# Patient Record
Sex: Male | Born: 1940 | Race: White | Hispanic: No | Marital: Married | Smoking: Former smoker
Health system: Southern US, Community
[De-identification: ages and names within clinical notes are randomized; demographics above are authoritative.]

## PROBLEM LIST (undated history)

## (undated) DIAGNOSIS — I10 Essential (primary) hypertension: Secondary | ICD-10-CM

## (undated) DIAGNOSIS — Z973 Presence of spectacles and contact lenses: Secondary | ICD-10-CM

## (undated) DIAGNOSIS — I2510 Atherosclerotic heart disease of native coronary artery without angina pectoris: Secondary | ICD-10-CM

## (undated) DIAGNOSIS — M1990 Unspecified osteoarthritis, unspecified site: Secondary | ICD-10-CM

## (undated) DIAGNOSIS — I739 Peripheral vascular disease, unspecified: Secondary | ICD-10-CM

## (undated) DIAGNOSIS — E785 Hyperlipidemia, unspecified: Secondary | ICD-10-CM

## (undated) DIAGNOSIS — R351 Nocturia: Secondary | ICD-10-CM

## (undated) DIAGNOSIS — Z951 Presence of aortocoronary bypass graft: Secondary | ICD-10-CM

## (undated) DIAGNOSIS — Z972 Presence of dental prosthetic device (complete) (partial): Secondary | ICD-10-CM

## (undated) DIAGNOSIS — C679 Malignant neoplasm of bladder, unspecified: Secondary | ICD-10-CM

## (undated) DIAGNOSIS — E039 Hypothyroidism, unspecified: Secondary | ICD-10-CM

## (undated) HISTORY — PX: CORONARY ARTERY BYPASS GRAFT: SHX141

## (undated) HISTORY — DX: Hyperlipidemia, unspecified: E78.5

## (undated) HISTORY — DX: Essential (primary) hypertension: I10

---

## 1974-05-10 HISTORY — PX: KNEE SURGERY: SHX244

## 1998-08-30 ENCOUNTER — Emergency Department (HOSPITAL_COMMUNITY): Admission: EM | Admit: 1998-08-30 | Discharge: 1998-08-30 | Payer: Self-pay | Admitting: Emergency Medicine

## 1998-09-05 ENCOUNTER — Emergency Department (HOSPITAL_COMMUNITY): Admission: EM | Admit: 1998-09-05 | Discharge: 1998-09-05 | Payer: Self-pay | Admitting: Emergency Medicine

## 2006-09-23 ENCOUNTER — Encounter (INDEPENDENT_AMBULATORY_CARE_PROVIDER_SITE_OTHER): Payer: Self-pay | Admitting: Family Medicine

## 2006-09-23 ENCOUNTER — Ambulatory Visit: Payer: Self-pay | Admitting: Family Medicine

## 2006-09-23 ENCOUNTER — Encounter: Payer: Self-pay | Admitting: Family Medicine

## 2006-09-23 DIAGNOSIS — I2510 Atherosclerotic heart disease of native coronary artery without angina pectoris: Secondary | ICD-10-CM | POA: Insufficient documentation

## 2006-09-23 DIAGNOSIS — E785 Hyperlipidemia, unspecified: Secondary | ICD-10-CM | POA: Insufficient documentation

## 2006-09-23 DIAGNOSIS — I10 Essential (primary) hypertension: Secondary | ICD-10-CM | POA: Insufficient documentation

## 2006-09-23 DIAGNOSIS — E039 Hypothyroidism, unspecified: Secondary | ICD-10-CM

## 2006-10-02 LAB — CONVERTED CEMR LAB
ALT: 38 units/L (ref 0–53)
AST: 30 units/L (ref 0–37)
BUN: 21 mg/dL (ref 6–23)
CO2: 23 meq/L (ref 19–32)
Chloride: 103 meq/L (ref 96–112)
Cholesterol: 174 mg/dL (ref 0–200)
Glucose, Bld: 93 mg/dL (ref 70–99)
Potassium: 5.2 meq/L (ref 3.5–5.3)
Sodium: 143 meq/L (ref 135–145)
Total CHOL/HDL Ratio: 4.4

## 2006-10-04 ENCOUNTER — Telehealth (INDEPENDENT_AMBULATORY_CARE_PROVIDER_SITE_OTHER): Payer: Self-pay | Admitting: *Deleted

## 2006-12-22 ENCOUNTER — Ambulatory Visit: Payer: Self-pay | Admitting: Family Medicine

## 2006-12-28 ENCOUNTER — Ambulatory Visit: Payer: Self-pay | Admitting: Family Medicine

## 2006-12-29 ENCOUNTER — Telehealth (INDEPENDENT_AMBULATORY_CARE_PROVIDER_SITE_OTHER): Payer: Self-pay | Admitting: *Deleted

## 2006-12-29 LAB — CONVERTED CEMR LAB
Triglycerides: 183 mg/dL — ABNORMAL HIGH (ref 0–149)
VLDL: 37 mg/dL (ref 0–40)

## 2007-03-08 ENCOUNTER — Ambulatory Visit: Payer: Self-pay | Admitting: Family Medicine

## 2007-05-31 ENCOUNTER — Ambulatory Visit: Payer: Self-pay | Admitting: Family Medicine

## 2007-05-31 LAB — CONVERTED CEMR LAB
CO2: 28 meq/L (ref 19–32)
Calcium: 9.6 mg/dL (ref 8.4–10.5)
Cholesterol: 158 mg/dL (ref 0–200)
GFR calc Af Amer: 86 mL/min
Glucose, Bld: 98 mg/dL (ref 70–99)
HDL: 38.6 mg/dL — ABNORMAL LOW (ref >39.0)
PSA: 0.66 ng/mL (ref 0.10–4.00)
Sodium: 140 meq/L (ref 135–145)
TSH: 1.74 microintl units/mL (ref 0.35–5.50)
Total CHOL/HDL Ratio: 4.1

## 2007-06-01 ENCOUNTER — Telehealth (INDEPENDENT_AMBULATORY_CARE_PROVIDER_SITE_OTHER): Payer: Self-pay | Admitting: *Deleted

## 2007-07-10 ENCOUNTER — Telehealth (INDEPENDENT_AMBULATORY_CARE_PROVIDER_SITE_OTHER): Payer: Self-pay | Admitting: *Deleted

## 2007-07-27 ENCOUNTER — Telehealth (INDEPENDENT_AMBULATORY_CARE_PROVIDER_SITE_OTHER): Payer: Self-pay | Admitting: *Deleted

## 2007-08-31 ENCOUNTER — Encounter (INDEPENDENT_AMBULATORY_CARE_PROVIDER_SITE_OTHER): Payer: Self-pay | Admitting: *Deleted

## 2007-08-31 ENCOUNTER — Ambulatory Visit: Payer: Self-pay | Admitting: Internal Medicine

## 2007-08-31 LAB — CONVERTED CEMR LAB
AST: 28 units/L (ref 0–37)
HDL: 34.5 mg/dL — ABNORMAL LOW (ref >39.0)
Triglycerides: 266 mg/dL (ref 0–149)

## 2007-09-04 ENCOUNTER — Telehealth: Payer: Self-pay | Admitting: Internal Medicine

## 2007-09-22 ENCOUNTER — Telehealth (INDEPENDENT_AMBULATORY_CARE_PROVIDER_SITE_OTHER): Payer: Self-pay | Admitting: *Deleted

## 2008-01-25 ENCOUNTER — Emergency Department (HOSPITAL_COMMUNITY): Admission: EM | Admit: 2008-01-25 | Discharge: 2008-01-25 | Payer: Self-pay | Admitting: Emergency Medicine

## 2009-01-17 ENCOUNTER — Encounter: Admission: RE | Admit: 2009-01-17 | Discharge: 2009-01-17 | Payer: Self-pay | Admitting: Internal Medicine

## 2009-01-18 ENCOUNTER — Encounter: Admission: RE | Admit: 2009-01-18 | Discharge: 2009-01-18 | Payer: Self-pay | Admitting: Internal Medicine

## 2009-02-19 ENCOUNTER — Inpatient Hospital Stay (HOSPITAL_COMMUNITY): Admission: RE | Admit: 2009-02-19 | Discharge: 2009-02-20 | Payer: Self-pay | Admitting: Neurosurgery

## 2009-02-19 HISTORY — PX: ANTERIOR CERVICAL DECOMP/DISCECTOMY FUSION: SHX1161

## 2010-03-18 ENCOUNTER — Encounter: Admission: RE | Admit: 2010-03-18 | Discharge: 2010-03-18 | Payer: Self-pay | Admitting: Internal Medicine

## 2010-08-05 ENCOUNTER — Other Ambulatory Visit: Payer: Self-pay | Admitting: Internal Medicine

## 2010-08-05 DIAGNOSIS — M79669 Pain in unspecified lower leg: Secondary | ICD-10-CM

## 2010-08-06 ENCOUNTER — Ambulatory Visit
Admission: RE | Admit: 2010-08-06 | Discharge: 2010-08-06 | Disposition: A | Discharge status: Home / Self Care | Payer: Medicare Other | Source: Ambulatory Visit | Attending: Internal Medicine | Admitting: Internal Medicine

## 2010-08-06 DIAGNOSIS — M79669 Pain in unspecified lower leg: Secondary | ICD-10-CM

## 2010-08-13 LAB — CBC
HCT: 43 % (ref 39.0–52.0)
Hemoglobin: 15 g/dL (ref 13.0–17.0)
MCHC: 34.8 g/dL (ref 30.0–36.0)
RDW: 13.4 % (ref 11.5–15.5)

## 2010-08-13 LAB — BASIC METABOLIC PANEL
CO2: 25 mEq/L (ref 19–32)
Glucose, Bld: 113 mg/dL — ABNORMAL HIGH (ref 70–99)
Potassium: 4.4 mEq/L (ref 3.5–5.1)
Sodium: 138 mEq/L (ref 135–145)

## 2014-02-25 ENCOUNTER — Ambulatory Visit (INDEPENDENT_AMBULATORY_CARE_PROVIDER_SITE_OTHER): Payer: No Typology Code available for payment source

## 2014-02-25 ENCOUNTER — Ambulatory Visit (INDEPENDENT_AMBULATORY_CARE_PROVIDER_SITE_OTHER): Payer: No Typology Code available for payment source | Admitting: Podiatry

## 2014-02-25 ENCOUNTER — Encounter: Payer: Self-pay | Admitting: Podiatry

## 2014-02-25 VITALS — BP 173/69 | HR 59 | Resp 16 | Ht 69.5 in | Wt 215.0 lb

## 2014-02-25 DIAGNOSIS — M79671 Pain in right foot: Secondary | ICD-10-CM

## 2014-02-25 DIAGNOSIS — M722 Plantar fascial fibromatosis: Secondary | ICD-10-CM

## 2014-02-25 DIAGNOSIS — R0989 Other specified symptoms and signs involving the circulatory and respiratory systems: Secondary | ICD-10-CM

## 2014-02-25 NOTE — Patient Instructions (Signed)
The vascular lab we'll contact you to schedule a lower extremity arterial Doppler to check circulation in your legs/feet  Bent - Knee  Calf Stretch  1) Stand an arm's length away from a wall. Place the palms of your hands on the wall. Step forward about 12 inches with the opposite foot.  2) Keeping toes pointed forward and both heels on the floor, bend both knees and lean forward. Hold this position for 60 seconds. Don't arch your back and don't hunch your shoulders.  3) Repeat this twice.  DO THIS STRETCHING TECHNIQUE 3 TIMES A DAY.   Stretching Exercises before Standing  Pull your toes up toward your nose and hold for 1 minute before standing.  A towel can assist with this exercise if you put the towel under the ball of your foot. This exercise reduces the intense   pain associated when changing from a seated to a standing position. This stretch can usually be the most beneficial if done before getting out of bed in the mornings.    Plantar Fasciitis Plantar fasciitis is a common condition that causes foot pain. It is soreness (inflammation) of the band of tough fibrous tissue on the bottom of the foot that runs from the heel bone (calcaneus) to the ball of the foot. The cause of this soreness may be from excessive standing, poor fitting shoes, running on hard surfaces, being overweight, having an abnormal walk, or overuse (this is common in runners) of the painful foot or feet. It is also common in aerobic exercise dancers and ballet dancers. SYMPTOMS  Most people with plantar fasciitis complain of:  Severe pain in the morning on the bottom of their foot especially when taking the first steps out of bed. This pain recedes after a few minutes of walking.  Severe pain is experienced also during walking following a long period of inactivity.  Pain is worse when walking barefoot or up stairs DIAGNOSIS   Your caregiver will diagnose this condition by examining and feeling your  foot.  Special tests such as X-rays of your foot, are usually not needed. PREVENTION   Consult a sports medicine professional before beginning a new exercise program.  Walking programs offer a good workout. With walking there is a lower chance of overuse injuries common to runners. There is less impact and less jarring of the joints.  Begin all new exercise programs slowly. If problems or pain develop, decrease the amount of time or distance until you are at a comfortable level.  Wear good shoes and replace them regularly.  Stretch your foot and the heel cords at the back of the ankle (Achilles tendon) both before and after exercise.  Run or exercise on even surfaces that are not hard. For example, asphalt is better than pavement.  Do not run barefoot on hard surfaces.  If using a treadmill, vary the incline.  Do not continue to workout if you have foot or joint problems. Seek professional help if they do not improve. HOME CARE INSTRUCTIONS   Avoid activities that cause you pain until you recover.  Use ice or cold packs on the problem or painful areas after working out.  Only take over-the-counter or prescription medicines for pain, discomfort, or fever as directed by your caregiver.  Soft shoe inserts or athletic shoes with air or gel sole cushions may be helpful.  If problems continue or become more severe, consult a sports medicine caregiver or your own health care provider. Cortisone is a potent  anti-inflammatory medication that may be injected into the painful area. You can discuss this treatment with your caregiver. MAKE SURE YOU:   Understand these instructions.  Will watch your condition.  Will get help right away if you are not doing well or get worse. Document Released: 01/19/2001 Document Revised: 07/19/2011 Document Reviewed: 03/20/2008 [facility] Patient Information 2015 [place], [place]. This information is not intended to replace advice given to you by your health  care provider. Make sure you discuss any questions you have with your health care provider.

## 2014-02-25 NOTE — Progress Notes (Signed)
   Subjective:    Patient ID: Andre Maldonado, male    DOB: 10-20-1940, 73 y.o.   MRN: [id number]  HPI Comments: N heel pain L right heel D 1 month O shoveling  C pulling, stinging, sharp stabbing pain A worse after resting, and in the morning T rx orthotics, OTC orthotics, and DR Scholl's shoes     Review of Systems  All other systems reviewed and are negative.      Objective:   Physical Exam   orientated x3  Vascular: DP pulses 1/4 bilaterally PT left 0/4 PT right 2/4  Neurological: Need ankle reflex equal and reactive bilaterally  Dermatological: Texture and turgor within normal limits  Musculoskeletal: Palpable tenderness medial central inferior heel right in the fascial band without any palpable lesions. This duplicates area of discomfort  X-ray examination right foot  Intact bony structure without fracture and/or dislocation  Inferior calcaneal spur  Hammertoe second  Radiographic impression:  No acute bony abnormality noted in the right foot  Inferior calcaneal spur     Assessment & Plan:   Assessment: Diminished pedal pulses rule out peripheral arterial disease Plantar fasciitis right  Plan: Patient is referred to vascular lab for the indication of diminished pedal pulses for a lower extremity arterial Doppler. Notify patient upon receipt of vascular lab  Offered patient Kenalog injection and he verbally consents. Skin is prepped with alcohol and Betadine and 10 mg of plain Xylocaine and 2.5 mg of plain Marcaine and 10 mg a Kenalog Injection inferior heel right for Kenalog injection #1  Bent knee stretching recommended Discuss correct shoeing  Reappoint for plantar fasciitis if symptoms are not improving within 30 days

## 2014-02-26 ENCOUNTER — Encounter: Payer: Self-pay | Admitting: Podiatry

## 2014-02-26 DIAGNOSIS — M722 Plantar fascial fibromatosis: Secondary | ICD-10-CM

## 2014-02-26 MED ORDER — TRIAMCINOLONE ACETONIDE 10 MG/ML IJ SUSP
10.0000 mg | Freq: Once | INTRAMUSCULAR | Status: AC
  Administered 2014-02-26: 10 mg

## 2014-03-05 ENCOUNTER — Ambulatory Visit (HOSPITAL_COMMUNITY)
Admission: RE | Admit: 2014-03-05 | Discharge: 2014-03-05 | Disposition: A | Discharge status: Home / Self Care | Payer: Medicare Other | Source: Ambulatory Visit | Attending: Cardiology | Admitting: Cardiology

## 2014-03-05 DIAGNOSIS — R0989 Other specified symptoms and signs involving the circulatory and respiratory systems: Secondary | ICD-10-CM | POA: Insufficient documentation

## 2014-03-05 NOTE — Progress Notes (Signed)
Arterial Duplex Lower Ext. Completed. Makel Mcmann, BS, RDMS, RVT  

## 2014-03-13 ENCOUNTER — Telehealth: Payer: Self-pay | Admitting: Cardiovascular Disease

## 2014-03-14 ENCOUNTER — Telehealth: Payer: Self-pay | Admitting: *Deleted

## 2014-03-14 NOTE — Telephone Encounter (Signed)
I spoke with patient about abnormal lower extremity dopplers.  He refused to follow up with Dr Gwenlyn Found concerning his PVD.  He voiced that he wanted to see him PCP first and then decide if he would like to follow up with Dr Gwenlyn Found.

## 2014-03-18 NOTE — Telephone Encounter (Signed)
Closed encounter °

## 2014-03-25 ENCOUNTER — Encounter: Payer: Self-pay | Admitting: Podiatry

## 2014-03-25 ENCOUNTER — Ambulatory Visit (INDEPENDENT_AMBULATORY_CARE_PROVIDER_SITE_OTHER): Payer: No Typology Code available for payment source | Admitting: Podiatry

## 2014-03-25 VITALS — BP 140/66 | HR 49 | Resp 14

## 2014-03-25 DIAGNOSIS — M722 Plantar fascial fibromatosis: Secondary | ICD-10-CM

## 2014-03-25 MED ORDER — TRIAMCINOLONE ACETONIDE 10 MG/ML IJ SUSP
10.0000 mg | Freq: Once | INTRAMUSCULAR | Status: AC
  Administered 2014-03-25: 10 mg

## 2014-03-25 NOTE — Patient Instructions (Signed)
Plantar Fasciitis  Plantar fasciitis is a common condition that causes foot pain. It is soreness (inflammation) of the band of tough fibrous tissue on the bottom of the foot that runs from the heel bone (calcaneus) to the ball of the foot. The cause of this soreness may be from excessive standing, poor fitting shoes, running on hard surfaces, being overweight, having an abnormal walk, or overuse (this is common in runners) of the painful foot or feet. It is also common in aerobic exercise dancers and ballet dancers.  SYMPTOMS   Most people with plantar fasciitis complain of:   Severe pain in the morning on the bottom of their foot especially when taking the first steps out of bed. This pain recedes after a few minutes of walking.   Severe pain is experienced also during walking following a long period of inactivity.   Pain is worse when walking barefoot or up stairs  DIAGNOSIS    Your caregiver will diagnose this condition by examining and feeling your foot.   Special tests such as X-rays of your foot, are usually not needed.  PREVENTION    Consult a sports medicine professional before beginning a new exercise program.   Walking programs offer a good workout. With walking there is a lower chance of overuse injuries common to runners. There is less impact and less jarring of the joints.   Begin all new exercise programs slowly. If problems or pain develop, decrease the amount of time or distance until you are at a comfortable level.   Wear good shoes and replace them regularly.   Stretch your foot and the heel cords at the back of the ankle (Achilles tendon) both before and after exercise.   Run or exercise on even surfaces that are not hard. For example, asphalt is better than pavement.   Do not run barefoot on hard surfaces.   If using a treadmill, vary the incline.   Do not continue to workout if you have foot or joint problems. Seek professional help if they do not improve.  HOME CARE INSTRUCTIONS     Avoid activities that cause you pain until you recover.   Use ice or cold packs on the problem or painful areas after working out.   Only take over-the-counter or prescription medicines for pain, discomfort, or fever as directed by your caregiver.   Soft shoe inserts or athletic shoes with air or gel sole cushions may be helpful.   If problems continue or become more severe, consult a sports medicine caregiver or your own health care provider. Cortisone is a potent anti-inflammatory medication that may be injected into the painful area. You can discuss this treatment with your caregiver.  MAKE SURE YOU:    Understand these instructions.   Will watch your condition.   Will get help right away if you are not doing well or get worse.  Document Released: 01/19/2001 Document Revised: 07/19/2011 Document Reviewed: 03/20/2008  [employer] Patient Information 2015 [employer]. This information is not intended to replace advice given to you by your health care provider. Make sure you discuss any questions you have with your health care provider.

## 2014-03-26 NOTE — Progress Notes (Signed)
Patient ID: Andre Maldonado, male   DOB: May 07, 1941, 73 y.o.   MRN: [id number]   Subjective: This patient presents again complaining of a painful right inferior heel. On the visit of 02/25/2014 Kenalog injection was given into the inferior right heel which brought him some temporary relief. He is requesting another injection  Patient had a lower extremity arterial Doppler on 03/05/2014. The left side ABI demonstrated moderate arterial insufficiency at rest Bilateral TBI's right toe 0.54 and left great toe 0.34,abnormal I recommend patient have follow-up with Dr. Gwenlyn Found. According to patient Dr. Kennon Holter office contacted patient and recommended he come in for surgical evaluation. Patient declined this visit and is seeing his cardiologist to evaluate this Doppler on January 2016  Objective: There is palpable tenderness the medial plantar right heel without any palpable lesions. This duplicates patient's discomfort There is no edema, erythema, warmth noted bilaterally.  Assessment: Peripheral arterial disease with pending evaluation with patient's cardiologist in January 2016 Plantar fasciitis right  Plan: Follow-up for abnormal arterial Doppler pending Offered patient repeat Kenalog injection and he verbally consents  The skin is prepped with alcohol and Betadine and 10 mg of Kenalog mixed with 10 mg of plain Xylocaine and 2.5 mg of plain Marcaine are injected inferior heel right for Kenalog injection #2  Shoeing and stretching discussed Reappoint at patient's request If symptoms persist will recommend a custom foot orthotic

## 2014-03-27 ENCOUNTER — Ambulatory Visit: Payer: Medicare Other | Admitting: Cardiovascular Disease

## 2014-03-29 ENCOUNTER — Telehealth: Payer: Self-pay | Admitting: Cardiovascular Disease

## 2014-03-29 ENCOUNTER — Telehealth: Payer: Self-pay | Admitting: *Deleted

## 2014-03-29 NOTE — Telephone Encounter (Signed)
-----   Message from Kendell Bane, [place] sent at 03/14/2014 10:25 AM EST ----- Mildly abnormal lower extremity arterial Doppler dated 03/11/2014  Contact Dr. Quay Burow and ask if he wants a follow-up consultation with this patient Depending on his response, then notify patient

## 2014-03-29 NOTE — Telephone Encounter (Signed)
Spoke to Dr Phoebe Perch office Delydia states Dr.Tuchman reviewed doppler. It  was read by Dr Gwenlyn Found. Dr Amalia Hailey wanted to know if Dr Gwenlyn Found needed to see patient in regards to lower extermities  artery doppler. RN informed Wonda Olds- will defer to Dr Gwenlyn Found Please contact patient if visit is needed and contact Dr Amalia Hailey of the decision either way She verbalized understanding.

## 2014-03-29 NOTE — Telephone Encounter (Signed)
I called and spoke to [facility].  I informed her that Dr. Amalia Hailey is inquiring whether or not Dr. Gwenlyn Found wants to follow-up with this patient or not based upon his doppler results.  "I will give a message to Dr. Gwenlyn Found.  Most likely he will want to see him.  If so we will call the patient and we will let you know as well."

## 2014-03-29 NOTE — Telephone Encounter (Signed)
Yes please arrange for patient to see me to discuss his Doppler studies

## 2014-04-02 NOTE — Telephone Encounter (Signed)
Please see telephone note with patient. He refuses to come in and see Dr Gwenlyn Found at this point in time.  He wants to follow up with his regular cardiologist first.

## 2014-05-14 ENCOUNTER — Encounter: Payer: Medicare Other | Admitting: Interventional Cardiology

## 2014-07-15 ENCOUNTER — Encounter: Payer: Self-pay | Admitting: Podiatry

## 2014-07-15 ENCOUNTER — Ambulatory Visit (INDEPENDENT_AMBULATORY_CARE_PROVIDER_SITE_OTHER): Payer: Medicare Other | Admitting: Podiatry

## 2014-07-15 VITALS — BP 149/72 | HR 44 | Resp 13 | Ht 70.0 in | Wt 225.0 lb

## 2014-07-15 DIAGNOSIS — M722 Plantar fascial fibromatosis: Secondary | ICD-10-CM | POA: Diagnosis not present

## 2014-07-15 NOTE — Patient Instructions (Signed)
  Discuss follow-up treatment options including Custom semirigid foot orthotics EPAT  Plantar Fasciitis Plantar fasciitis is a common condition that causes foot pain. It is soreness (inflammation) of the band of tough fibrous tissue on the bottom of the foot that runs from the heel bone (calcaneus) to the ball of the foot. The cause of this soreness may be from excessive standing, poor fitting shoes, running on hard surfaces, being overweight, having an abnormal walk, or overuse (this is common in runners) of the painful foot or feet. It is also common in aerobic exercise dancers and ballet dancers. SYMPTOMS  Most people with plantar fasciitis complain of:  Severe pain in the morning on the bottom of their foot especially when taking the first steps out of bed. This pain recedes after a few minutes of walking.  Severe pain is experienced also during walking following a long period of inactivity.  Pain is worse when walking barefoot or up stairs DIAGNOSIS   Your caregiver will diagnose this condition by examining and feeling your foot.  Special tests such as X-rays of your foot, are usually not needed. PREVENTION   Consult a sports medicine professional before beginning a new exercise program.  Walking programs offer a good workout. With walking there is a lower chance of overuse injuries common to runners. There is less impact and less jarring of the joints.  Begin all new exercise programs slowly. If problems or pain develop, decrease the amount of time or distance until you are at a comfortable level.  Wear good shoes and replace them regularly.  Stretch your foot and the heel cords at the back of the ankle (Achilles tendon) both before and after exercise.  Run or exercise on even surfaces that are not hard. For example, asphalt is better than pavement.  Do not run barefoot on hard surfaces.  If using a treadmill, vary the incline.  Do not continue to workout if you have foot  or joint problems. Seek professional help if they do not improve. HOME CARE INSTRUCTIONS   Avoid activities that cause you pain until you recover.  Use ice or cold packs on the problem or painful areas after working out.  Only take over-the-counter or prescription medicines for pain, discomfort, or fever as directed by your caregiver.  Soft shoe inserts or athletic shoes with air or gel sole cushions may be helpful.  If problems continue or become more severe, consult a sports medicine caregiver or your own health care provider. Cortisone is a potent anti-inflammatory medication that may be injected into the painful area. You can discuss this treatment with your caregiver. MAKE SURE YOU:   Understand these instructions.  Will watch your condition.  Will get help right away if you are not doing well or get worse. Document Released: 01/19/2001 Document Revised: 07/19/2011 Document Reviewed: 03/20/2008 [facility] Patient Information 2015 [place], [place]. This information is not intended to replace advice given to you by your health care provider. Make sure you discuss any questions you have with your health care provider.

## 2014-07-15 NOTE — Progress Notes (Signed)
   Subjective:    Patient ID: Andre Maldonado, male    DOB: [birth year], 74 y.o.   MRN: [id number]  HPI Comments: Pt states he has been treating the worsening pain in the right heel with OTC orthotics an cut down the excess to match the rx orthotics by Dr. Janus Molder.  Patient presents again complaining of right inferior heel pain. She's had 2 Kenalog injections inferior right heel with temporary relief. He has a history of plantar fasciitis some years ago and has an existing pair of rigid orthotics that he says that were never comfortable and he has them with him today. He says that he wants to consider surgical treatment or the right heel pain. He was advised to have follow-up care for an abnormal arterial Doppler on 03/05/2014 with a TBI right of 0.54 and TBI left 0.34. Patient would not have follow-up by Dr. Gwenlyn Found for this. He said he was going to go to his cardiologist, however, cannot find any record that indication he had this done.   Review of Systems  All other systems reviewed and are negative.      Objective:   Physical Exam  Orientated 3 No erythema, edema, warmth noted in feet bilaterally palpable tenderness medial plantar right inferior heel without any palpable lesions       Assessment & Plan:   Assessment: Plantar fasciitis right Peripheral arterial disease bilaterally Intolerance to hard rigid orthotic  Plan: I had a discussion with patient today and made him aware that his vascular status was decreased based on his arterial Doppler I total not thought surgery at this time was not an option for him because of his decreased vascular status he seems to nod that he has any circulation problem , however, I explained to him that his vascular examination confirmed decreased circulation  I offered him 1 more Kenalog injection and recommended a semirigid foot orthotic. He he opted for the Kenalog injection and wanted to check on the cost of the custom foot orthotics I also  discussed the option of EPAT as well I made him aware that both these options were noncovered by Medicare and he could discussed of these of these to self-pay options.  Skin is prepped with alcohol and Betadine and 10 mg of Kenalog mixed with 10 mg of plain Xylocaine and 2.5 mg of plain Marcaine were injected inferior heel right for Kenalog injection #3 into the inferior right heel.

## 2014-07-16 DIAGNOSIS — M722 Plantar fascial fibromatosis: Secondary | ICD-10-CM | POA: Diagnosis not present

## 2014-07-16 MED ORDER — TRIAMCINOLONE ACETONIDE 10 MG/ML IJ SUSP
10.0000 mg | Freq: Once | INTRAMUSCULAR | Status: AC
  Administered 2014-07-16: 10 mg

## 2015-07-23 ENCOUNTER — Other Ambulatory Visit: Payer: Self-pay | Admitting: Internal Medicine

## 2015-07-23 ENCOUNTER — Ambulatory Visit
Admission: RE | Admit: 2015-07-23 | Discharge: 2015-07-23 | Disposition: A | Discharge status: Home / Self Care | Payer: Medicare Other | Source: Ambulatory Visit | Attending: Internal Medicine | Admitting: Internal Medicine

## 2015-07-23 DIAGNOSIS — M79671 Pain in right foot: Secondary | ICD-10-CM

## 2016-03-29 ENCOUNTER — Other Ambulatory Visit: Payer: Self-pay | Admitting: Urology

## 2016-04-08 ENCOUNTER — Encounter (HOSPITAL_BASED_OUTPATIENT_CLINIC_OR_DEPARTMENT_OTHER): Payer: Self-pay | Admitting: *Deleted

## 2016-04-08 NOTE — Progress Notes (Signed)
NPO AFTER MN.  ARRIVE AT 0800.  NEED ISTAT AND EKG.  WILL TAKE METOPROLOL AND SYNTHROID AM DOS W/ SIPS OF WATER.

## 2016-04-09 NOTE — H&P (Signed)
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  He originally presented with microscopic hematuria in the remote past.  He did not follow up appropriately.  Recently he presented with gross hematuria.  Cystoscopy revealed a 2 cm tumor in the trigonal area, just to the right of the midline.  CT scan revealed no other abnormalities suggestive of progression of this disease process.  He presents at this time for resection with placement of epirubicin postoperatively.  PMH: Past Medical History:  Diagnosis Date  . Arthritis    hands, elbows, knees  . Bladder cancer [facility])    urologist-  [name]  . Coronary arteriosclerosis in native artery    s/p  cabg x1  1981--  Cardiologist-- dr [name]  LOV 02-05-2009  . Hyperlipidemia   . Hypertension   . Hypothyroidism   . Nocturia   . Peripheral vascular disease ([place])    per duplex 03-05-2014  occluded left  SFA  distal level reconstitution at the popliteal  . 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 Wears dentures    upper  . Wears glasses     PSH: Past Surgical History:  Procedure Laterality Date  . ANTERIOR CERVICAL DECOMP/DISCECTOMY FUSION  02/19/2009   C5 -- C6  . CORONARY ARTERY BYPASS GRAFT  1981   in [place] , [place]   x1  LIMA to LAD  . KNEE SURGERY Left 1976    Allergies: No Known Allergies  Medications: No prescriptions prior to admission.     Social History: Social History   Social History  . Marital status: Married    Spouse name: N/A  . Number of children: N/A  . Years of education: N/A   Occupational History  . Not on file.   Social History Main Topics  . Smoking status: Former Smoker    Years: 30.00    Types: Cigarettes    Quit date: 04/08/1985  . Smokeless tobacco: Never Used  . Alcohol use Yes     Comment: occasional  . Drug use: No  . Sexual activity: Not on file   Other Topics  Concern  . Not on file   Social History Narrative  . No narrative on file    Family History: History reviewed. No pertinent family history.  Review of Systems: Positive: gross hematuria, lower urinary tract symptoms Negative:  A further 10 point review of systems was negative except what is listed in the HPI.                  Physical Exam: @VITALS2 @ General: No acute distress.  Awake. Head:  Normocephalic.  Atraumatic. ENT:  EOMI.  Mucous membranes moist Neck:  Supple.  No lymphadenopathy. CV:  S1 present. S2 present. Regular rate. Pulmonary: Equal effort bilaterally.  Clear to auscultation bilaterally. Abdomen: Soft.  Non- tender to palpation. Skin:  Normal turgor.  No visible rash. Extremity: No gross deformity of bilateral upper extremities.  No gross deformity of                             lower extremities. Neurologic: Alert. Appropriate mood.    Studies:  No results for input(s): HGB, WBC, PLT in the last 72 hours.  No results for input(s): NA, K, CL, CO2, BUN, CREATININE, CALCIUM, GFRNONAA, GFRAA in the  last 72 hours.  Invalid input(s): MAGNESIUM   No results for input(s): INR, APTT in the last 72 hours.  Invalid input(s): PT   Invalid input(s): ABG    Assessment:  Gross hematuria secondary to bladder cancer  Plan: TURBT placement of epirubicin intravesically postoperatively.

## 2016-04-12 ENCOUNTER — Ambulatory Visit (HOSPITAL_BASED_OUTPATIENT_CLINIC_OR_DEPARTMENT_OTHER): Payer: Medicare Other | Admitting: Anesthesiology

## 2016-04-12 ENCOUNTER — Other Ambulatory Visit: Payer: Self-pay

## 2016-04-12 ENCOUNTER — Ambulatory Visit (HOSPITAL_BASED_OUTPATIENT_CLINIC_OR_DEPARTMENT_OTHER)
Admission: RE | Admit: 2016-04-12 | Discharge: 2016-04-12 | Disposition: A | Discharge status: Home / Self Care | Payer: Medicare Other | Source: Ambulatory Visit | Attending: Urology | Admitting: Urology

## 2016-04-12 ENCOUNTER — Encounter (HOSPITAL_BASED_OUTPATIENT_CLINIC_OR_DEPARTMENT_OTHER)
Admission: RE | Disposition: A | Discharge status: Home / Self Care | Payer: Self-pay | Source: Ambulatory Visit | Attending: Urology

## 2016-04-12 ENCOUNTER — Encounter (HOSPITAL_BASED_OUTPATIENT_CLINIC_OR_DEPARTMENT_OTHER): Payer: Self-pay | Admitting: *Deleted

## 2016-04-12 DIAGNOSIS — C679 Malignant neoplasm of bladder, unspecified: Secondary | ICD-10-CM | POA: Insufficient documentation

## 2016-04-12 DIAGNOSIS — E785 Hyperlipidemia, unspecified: Secondary | ICD-10-CM | POA: Diagnosis not present

## 2016-04-12 DIAGNOSIS — M19022 Primary osteoarthritis, left elbow: Secondary | ICD-10-CM | POA: Insufficient documentation

## 2016-04-12 DIAGNOSIS — E039 Hypothyroidism, unspecified: Secondary | ICD-10-CM | POA: Diagnosis not present

## 2016-04-12 DIAGNOSIS — M19042 Primary osteoarthritis, left hand: Secondary | ICD-10-CM | POA: Diagnosis not present

## 2016-04-12 DIAGNOSIS — I2510 Atherosclerotic heart disease of native coronary artery without angina pectoris: Secondary | ICD-10-CM | POA: Diagnosis not present

## 2016-04-12 DIAGNOSIS — Z951 Presence of aortocoronary bypass graft: Secondary | ICD-10-CM | POA: Insufficient documentation

## 2016-04-12 DIAGNOSIS — M170 Bilateral primary osteoarthritis of knee: Secondary | ICD-10-CM | POA: Insufficient documentation

## 2016-04-12 DIAGNOSIS — D494 Neoplasm of unspecified behavior of bladder: Secondary | ICD-10-CM | POA: Diagnosis present

## 2016-04-12 DIAGNOSIS — I10 Essential (primary) hypertension: Secondary | ICD-10-CM | POA: Insufficient documentation

## 2016-04-12 DIAGNOSIS — M19021 Primary osteoarthritis, right elbow: Secondary | ICD-10-CM | POA: Diagnosis not present

## 2016-04-12 DIAGNOSIS — M19041 Primary osteoarthritis, right hand: Secondary | ICD-10-CM | POA: Insufficient documentation

## 2016-04-12 DIAGNOSIS — I739 Peripheral vascular disease, unspecified: Secondary | ICD-10-CM | POA: Diagnosis not present

## 2016-04-12 DIAGNOSIS — Z9889 Other specified postprocedural states: Secondary | ICD-10-CM | POA: Diagnosis not present

## 2016-04-12 DIAGNOSIS — Z87891 Personal history of nicotine dependence: Secondary | ICD-10-CM | POA: Insufficient documentation

## 2016-04-12 HISTORY — DX: Presence of spectacles and contact lenses: Z97.3

## 2016-04-12 HISTORY — DX: Nocturia: R35.1

## 2016-04-12 HISTORY — DX: Atherosclerotic heart disease of native coronary artery without angina pectoris: I25.10

## 2016-04-12 HISTORY — DX: Unspecified osteoarthritis, unspecified site: M19.90

## 2016-04-12 HISTORY — PX: TRANSURETHRAL RESECTION OF BLADDER TUMOR: SHX2575

## 2016-04-12 HISTORY — DX: Presence of dental prosthetic device (complete) (partial): Z97.2

## 2016-04-12 HISTORY — DX: Presence of aortocoronary bypass graft: Z95.1

## 2016-04-12 HISTORY — DX: Peripheral vascular disease, unspecified: I73.9

## 2016-04-12 HISTORY — DX: Hypothyroidism, unspecified: E03.9

## 2016-04-12 HISTORY — DX: Malignant neoplasm of bladder, unspecified: C67.9

## 2016-04-12 LAB — POCT I-STAT, CHEM 8
BUN: 19 mg/dL (ref 6–20)
CHLORIDE: 107 mmol/L (ref 101–111)
CREATININE: 1 mg/dL (ref 0.61–1.24)
Calcium, Ion: 1.19 mmol/L (ref 1.15–1.40)
GLUCOSE: 98 mg/dL (ref 65–99)
HCT: 43 % (ref 39.0–52.0)
HEMOGLOBIN: 14.6 g/dL (ref 13.0–17.0)
POTASSIUM: 4 mmol/L (ref 3.5–5.1)
Sodium: 143 mmol/L (ref 135–145)
TCO2: 22 mmol/L (ref 0–100)

## 2016-04-12 SURGERY — TURBT (TRANSURETHRAL RESECTION OF BLADDER TUMOR)
Anesthesia: General | Site: Bladder

## 2016-04-12 MED ORDER — HYDROMORPHONE HCL 1 MG/ML IJ SOLN
INTRAMUSCULAR | Status: AC
  Filled 2016-04-12: qty 0.5

## 2016-04-12 MED ORDER — FENTANYL CITRATE (PF) 100 MCG/2ML IJ SOLN
INTRAMUSCULAR | Status: AC
  Filled 2016-04-12: qty 2

## 2016-04-12 MED ORDER — PROPOFOL 10 MG/ML IV BOLUS
INTRAVENOUS | Status: AC
  Filled 2016-04-12: qty 20

## 2016-04-12 MED ORDER — HYDROMORPHONE HCL 1 MG/ML IJ SOLN
0.2500 mg | INTRAMUSCULAR | Status: DC | PRN
  Administered 2016-04-12: 0.5 mg via INTRAVENOUS
  Filled 2016-04-12: qty 0.5

## 2016-04-12 MED ORDER — LIDOCAINE 2% (20 MG/ML) 5 ML SYRINGE
INTRAMUSCULAR | Status: AC
  Filled 2016-04-12: qty 5

## 2016-04-12 MED ORDER — OXYBUTYNIN CHLORIDE 5 MG PO TABS
5.0000 mg | ORAL_TABLET | Freq: Three times a day (TID) | ORAL | Status: DC
  Filled 2016-04-12: qty 1

## 2016-04-12 MED ORDER — DEXAMETHASONE SODIUM PHOSPHATE 4 MG/ML IJ SOLN
INTRAMUSCULAR | Status: DC | PRN
  Administered 2016-04-12: 10 mg via INTRAVENOUS

## 2016-04-12 MED ORDER — HYDROMORPHONE HCL 1 MG/ML IJ SOLN
0.2500 mg | INTRAMUSCULAR | Status: DC | PRN
  Filled 2016-04-12: qty 0.5

## 2016-04-12 MED ORDER — CEFAZOLIN SODIUM-DEXTROSE 2-4 GM/100ML-% IV SOLN
2.0000 g | INTRAVENOUS | Status: AC
  Administered 2016-04-12: 2 g via INTRAVENOUS
  Filled 2016-04-12: qty 100

## 2016-04-12 MED ORDER — PROMETHAZINE HCL 25 MG/ML IJ SOLN
6.2500 mg | INTRAMUSCULAR | Status: DC | PRN
  Filled 2016-04-12: qty 1

## 2016-04-12 MED ORDER — CEFAZOLIN IN D5W 1 GM/50ML IV SOLN
1.0000 g | INTRAVENOUS | Status: DC
  Filled 2016-04-12: qty 50

## 2016-04-12 MED ORDER — EPHEDRINE 5 MG/ML INJ
INTRAVENOUS | Status: AC
  Filled 2016-04-12: qty 10

## 2016-04-12 MED ORDER — SODIUM CHLORIDE 0.9 % IV SOLN
50.0000 mg | Freq: Once | INTRAVENOUS | Status: AC
  Administered 2016-04-12: 50 mg via INTRAVESICAL
  Filled 2016-04-12: qty 25

## 2016-04-12 MED ORDER — PROPOFOL 10 MG/ML IV BOLUS
INTRAVENOUS | Status: DC | PRN
  Administered 2016-04-12: 20 mg via INTRAVENOUS
  Administered 2016-04-12: 150 mg via INTRAVENOUS

## 2016-04-12 MED ORDER — PHENYLEPHRINE 40 MCG/ML (10ML) SYRINGE FOR IV PUSH (FOR BLOOD PRESSURE SUPPORT)
PREFILLED_SYRINGE | INTRAVENOUS | Status: AC
  Filled 2016-04-12: qty 10

## 2016-04-12 MED ORDER — OXYBUTYNIN CHLORIDE 5 MG PO TABS
5.0000 mg | ORAL_TABLET | Freq: Three times a day (TID) | ORAL | Status: DC
  Administered 2016-04-12: 5 mg via ORAL
  Filled 2016-04-12: qty 1

## 2016-04-12 MED ORDER — EPHEDRINE SULFATE 50 MG/ML IJ SOLN
INTRAMUSCULAR | Status: DC | PRN
  Administered 2016-04-12 (×2): 10 mg via INTRAVENOUS

## 2016-04-12 MED ORDER — OXYBUTYNIN CHLORIDE 5 MG PO TABS: 5.0000 mg | ORAL_TABLET | Freq: Three times a day (TID) | ORAL | 1 refills | Status: DC | PRN

## 2016-04-12 MED ORDER — CEPHALEXIN 500 MG PO CAPS: 500.0000 mg | ORAL_CAPSULE | Freq: Two times a day (BID) | ORAL | 0 refills | Status: DC

## 2016-04-12 MED ORDER — LIDOCAINE HCL (CARDIAC) 20 MG/ML IV SOLN
INTRAVENOUS | Status: DC | PRN
  Administered 2016-04-12: 100 mg via INTRAVENOUS

## 2016-04-12 MED ORDER — DEXAMETHASONE SODIUM PHOSPHATE 10 MG/ML IJ SOLN
INTRAMUSCULAR | Status: AC
Start: 2016-04-12 — End: 2016-04-12
  Filled 2016-04-12: qty 1

## 2016-04-12 MED ORDER — ONDANSETRON HCL 4 MG/2ML IJ SOLN
INTRAMUSCULAR | Status: DC | PRN
  Administered 2016-04-12: 4 mg via INTRAVENOUS

## 2016-04-12 MED ORDER — LACTATED RINGERS IV SOLN
INTRAVENOUS | Status: DC
  Administered 2016-04-12 (×2): via INTRAVENOUS
  Filled 2016-04-12: qty 1000

## 2016-04-12 MED ORDER — FENTANYL CITRATE (PF) 100 MCG/2ML IJ SOLN
INTRAMUSCULAR | Status: DC | PRN
  Administered 2016-04-12 (×2): 50 ug via INTRAVENOUS

## 2016-04-12 MED ORDER — OXYBUTYNIN CHLORIDE 5 MG PO TABS
ORAL_TABLET | ORAL | Status: AC
  Filled 2016-04-12: qty 1

## 2016-04-12 MED ORDER — SODIUM CHLORIDE 0.9 % IR SOLN
Status: DC | PRN
  Administered 2016-04-12: 3000 mL via INTRAVESICAL

## 2016-04-12 MED ORDER — CEFAZOLIN SODIUM-DEXTROSE 2-4 GM/100ML-% IV SOLN
INTRAVENOUS | Status: AC
  Filled 2016-04-12: qty 100

## 2016-04-12 MED ORDER — ONDANSETRON HCL 4 MG/2ML IJ SOLN
INTRAMUSCULAR | Status: AC
  Filled 2016-04-12: qty 2

## 2016-04-12 SURGICAL SUPPLY — 24 items
BAG DRAIN URO-CYSTO SKYTR STRL (DRAIN) ×3 IMPLANT
BAG DRN UROCATH (DRAIN) ×1
CATH FOLEY 2WAY SLVR  5CC 20FR (CATHETERS) ×2
CATH FOLEY 2WAY SLVR 5CC 20FR (CATHETERS) IMPLANT
CLOTH BEACON ORANGE TIMEOUT ST (SAFETY) ×3 IMPLANT
ELECT BUTTON BIOP 24F 90D PLAS (MISCELLANEOUS) IMPLANT
ELECT LOOP 22F BIPOLAR SML (ELECTROSURGICAL) ×3
ELECT REM PT RETURN 9FT ADLT (ELECTROSURGICAL) ×3
ELECTRODE LOOP 22F BIPOLAR SML (ELECTROSURGICAL) IMPLANT
ELECTRODE REM PT RTRN 9FT ADLT (ELECTROSURGICAL) ×1 IMPLANT
EVACUATOR MICROVAS BLADDER (UROLOGICAL SUPPLIES) ×2 IMPLANT
GLOVE BIO SURGEON STRL SZ8 (GLOVE) ×3 IMPLANT
GLOVE INDICATOR 7.0 STRL GRN (GLOVE) ×2 IMPLANT
GOWN STRL REUS W/ TWL XL LVL3 (GOWN DISPOSABLE) ×1 IMPLANT
GOWN STRL REUS W/TWL XL LVL3 (GOWN DISPOSABLE) ×5 IMPLANT
GOWN XL W/COTTON TOWEL STD (GOWNS) ×3 IMPLANT
HOLDER FOLEY CATH W/STRAP (MISCELLANEOUS) ×2 IMPLANT
KIT ROOM TURNOVER WOR (KITS) ×3 IMPLANT
MANIFOLD NEPTUNE II (INSTRUMENTS) ×2 IMPLANT
PACK CYSTO (CUSTOM PROCEDURE TRAY) ×3 IMPLANT
PLUG CATH AND CAP STER (CATHETERS) IMPLANT
SYRINGE IRR TOOMEY STRL 70CC (SYRINGE) IMPLANT
TUBE CONNECTING 12'X1/4 (SUCTIONS) ×1
TUBE CONNECTING 12X1/4 (SUCTIONS) ×1 IMPLANT

## 2016-04-12 NOTE — Anesthesia Procedure Notes (Addendum)
Procedure Name: LMA Insertion Date/Time: 04/12/2016 9:47 AM Performed by: Finis Bud Pre-anesthesia Checklist: Patient identified, Emergency Drugs available, Suction available and Patient being monitored Patient Re-evaluated:Patient Re-evaluated prior to inductionOxygen Delivery Method: Circle system utilized Preoxygenation: Pre-oxygenation with 100% oxygen Intubation Type: IV induction Ventilation: Mask ventilation without difficulty LMA: LMA inserted LMA Size: 4.0 Number of attempts: 1 Airway Equipment and Method: Bite block Placement Confirmation: positive ETCO2 Tube secured with: Tape Dental Injury: Teeth and Oropharynx as per pre-operative assessment

## 2016-04-12 NOTE — Anesthesia Postprocedure Evaluation (Signed)
Anesthesia Post Note  Patient: Andre Maldonado  Procedure(s) Performed: Procedure(s) (LRB): TRANSURETHRAL RESECTION OF BLADDER TUMOR (TURBT) WITH EPIRUBICIN  INSTILLATION (N/A)  Patient location during evaluation: PACU Anesthesia Type: General Level of consciousness: awake Pain management: pain level controlled Vital Signs Assessment: post-procedure vital signs reviewed and stable Respiratory status: spontaneous breathing Cardiovascular status: stable Anesthetic complications: no    Last Vitals:  Vitals:   04/12/16 1027 04/12/16 1030  BP: (!) 177/63 (!) 162/66  Pulse: 66 64  Resp: 12 13  Temp: 36.1 C     Last Pain:  Vitals:   04/12/16 0827  TempSrc: Oral                 EDWARDS,Dejana Pugsley

## 2016-04-12 NOTE — Discharge Instructions (Addendum)
Transurethral Resection of Bladder Tumor (TURBT)  Definition:  Transurethral Resection of the Bladder Tumor is a surgical procedure used to diagnose and remove tumors within the bladder. TURBT is the most common treatment for early stage bladder cancer.   It is okay to remove catheter as instructed on Tuesday morning. Resume ASA when urine clear  General instructions:  Your recent bladder surgery requires very little post hospital care but some definite precautions.  Despite the fact that no skin incisions were used, the area around the tumor removal site is raw and covered with scabs to promote healing and prevent bleeding. Certain precautions are needed to insure that the scabs are not disturbed over the next 2-4 weeks while the healing proceeds.  Because the raw surface inside your bladder and the irritating effects of urine you may expect frequency of urination and/or urgency (a stronger desire to urinate) and perhaps even getting up at night more often. This will usually resolve or improve slowly over the healing period. You may see some blood in your urine over the first 6 weeks. Do not be alarmed, even if the urine was clear for a while. Get off your feet and drink lots of fluids until clearing occurs. If you start to pass clots or don't improve call us.   Diet:  You may return to your normal diet immediately. Because of the raw surface of your bladder, alcohol, spicy foods, foods high in acid and drinks with caffeine may cause irritation or frequency and should be used in moderation. To keep your urine flowing freely and avoid constipation, drink plenty of fluids during the day (8-10 glasses). Tip: Avoid cranberry juice because it is very acidic.   Activity:  Your physical activity needs to be restricted somewhat.  We suggest that you reduce your activity under the circumstances until the bleeding has stopped. Heavy lifting (greater than 20 lbs.) and heavy exertion should be limited for 2-3  weeks.  Bowels:  It is important to keep your bowels regular during the postoperative period. Straining with bowel movements can cause bleeding. A bowel movement every other day is reasonable. Use a mild laxative if needed, such as milk of magnesia 2-3 tablespoons, or 2 Dulcolax tablets. Call if you continue to have problems. If you had been taking narcotics for pain, before, during or after your surgery, you may be constipated.   Medication:  You should resume your pre-surgery medications unless told not to. In addition you may be given an antibiotic to prevent or treat infection. Antibiotics are not always necessary. All medication should be taken as prescribed until the bottles are finished unless you are having an unusual reaction to one of the drugs.  General Anesthetic, Adult  A doctor specialized in giving anesthesia (anesthesiologist) or a nurse specialized in giving anesthesia (nurse anesthetist) gives medicine that makes you sleep while a procedure is performed (general anesthetic). Once the general anesthetic has been administered, you will be in a sleeplike state in which you feel no pain. After having a general anesthetic you may feel:  Dizzy.  Weak.  Drowsy.  Confused.  These feelings are normal and can be expected to last for up to 24 hours after the procedure.   LET YOUR CAREGIVER KNOW ABOUT:  Allergies you have.  Medications you are taking, including herbs, eye drops, over the counter medications, dietary supplements, and creams.  Previous problems you have had with anesthetics or numbing medicines.  Use of cigarettes, alcohol, or illicit drugs.  Possibility  of pregnancy, if this applies.  History of bleeding or blood disorders, including blood clots and clotting disorders.  Previous surgeries you have had and types of anesthetics you have received.  Family medical history, especially anesthetic problems.  Other health problems.   AFTER THE PROCEDURE  After surgery, you  will be taken to the recovery area where a nurse will monitor your progress. You will be allowed to go home when you are awake, stable, taking fluids well, and without serious pain or complications.  For the first 24 hours following an anesthetic:  Have a responsible person with you.  Do not drive a car. If you are alone, do not take public transportation.  Do not engage in strenuous activity. You may usually resume normal activities the next day, or as advised by your caregiver.  Do not drink alcohol.  Do not take medicine that has not been prescribed by your caregiver.  Do not sign important papers or make important decisions as your judgement may be impaired.  You may resume a normal diet as directed.  Change bandages (dressings) as directed.  Only take over-the-counter or prescription medicines for pain, discomfort, or fever as directed by your caregiver.  If you have questions or problems that seem related to the anesthetic, call the hospital and ask for the anesthetist, anesthesiologist, or anesthesia department.   SEEK IMMEDIATE MEDICAL CARE IF:  You develop a rash.  You have difficulty breathing.  You have chest pain.  You have allergic problems.  You have uncontrolled nausea.  You have uncontrolled vomiting.  You develop any serious bleeding, especially from the incision site.  Document Released: 08/03/2007 Document Revised: 01/06/2011 Document Reviewed: 08/27/2010  [facility] Patient Information 2012 [place].    Post Anesthesia Home Care Instructions  Activity: Get plenty of rest for the remainder of the day. A responsible adult should stay with you for 24 hours following the procedure.  For the next 24 hours, DO NOT: -Drive a car -Paediatric nurse -Drink alcoholic beverages -Take any medication unless instructed by your physician -Make any legal decisions or sign important papers.  Meals: Start with liquid foods such as gelatin or soup. Progress to regular foods  as tolerated. Avoid greasy, spicy, heavy foods. If nausea and/or vomiting occur, drink only clear liquids until the nausea and/or vomiting subsides. Call your physician if vomiting continues.  Special Instructions/Symptoms: Your throat may feel dry or sore from the anesthesia or the breathing tube placed in your throat during surgery. If this causes discomfort, gargle with warm salt water. The discomfort should disappear within 24 hours.  If you had a scopolamine patch placed behind your ear for the management of post- operative nausea and/or vomiting:  1. The medication in the patch is effective for 72 hours, after which it should be removed.  Wrap patch in a tissue and discard in the trash. Wash hands thoroughly with soap and water. 2. You may remove the patch earlier than 72 hours if you experience unpleasant side effects which may include dry mouth, dizziness or visual disturbances. 3. Avoid touching the patch. Wash your hands with soap and water after contact with the patch.

## 2016-04-12 NOTE — Transfer of Care (Signed)
Last Vitals:  Vitals:   04/12/16 1145 04/12/16 1200  BP: (!) 171/60 (!) 179/71  Pulse: 61 (!) 59  Resp: (!) 9 14  Temp:      Last Pain:  Vitals:   04/12/16 0827  TempSrc: Oral      Patients Stated Pain Goal: 7 (04/12/16 0906) Immediate Anesthesia Transfer of Care Note  Patient: Andre Maldonado  Procedure(s) Performed: Procedure(s) (LRB): TRANSURETHRAL RESECTION OF BLADDER TUMOR (TURBT) WITH EPIRUBICIN  INSTILLATION (N/A)  Patient Location: PACU  Anesthesia Type: General  Level of Consciousness: awake, alert  and oriented  Airway & Oxygen Therapy: Patient Spontanous Breathing and Patient connected to face mask oxygen  Post-op Assessment: Report given to PACU RN and Post -op Vital signs reviewed and stable  Post vital signs: Reviewed and stable  Complications: No apparent anesthesia complications

## 2016-04-12 NOTE — Anesthesia Preprocedure Evaluation (Addendum)
Anesthesia Evaluation  Patient identified by MRN, date of birth, ID band Patient awake    Reviewed: Allergy & Precautions, NPO status , Patient's Chart, lab work & pertinent test results  Airway Mallampati: II  TM Distance: >3 FB     Dental   Pulmonary former smoker,    breath sounds clear to auscultation       Cardiovascular hypertension, + CAD and + Peripheral Vascular Disease   Rhythm:Regular Rate:Normal     Neuro/Psych    GI/Hepatic negative GI ROS, Neg liver ROS,   Endo/Other  Hypothyroidism   Renal/GU negative Renal ROS     Musculoskeletal  (+) Arthritis ,   Abdominal   Peds  Hematology   Anesthesia Other Findings   Reproductive/Obstetrics                             Anesthesia Physical Anesthesia Plan  ASA: III  Anesthesia Plan: General   Post-op Pain Management:    Induction: Intravenous  Airway Management Planned: LMA  Additional Equipment:   Intra-op Plan:   Post-operative Plan: Extubation in OR  Informed Consent: I have reviewed the patients History and Physical, chart, labs and discussed the procedure including the risks, benefits and alternatives for the proposed anesthesia with the patient or authorized representative who has indicated his/her understanding and acceptance.   Dental advisory given  Plan Discussed with: CRNA and Anesthesiologist  Anesthesia Plan Comments:         Anesthesia Quick Evaluation

## 2016-04-12 NOTE — Op Note (Signed)
Preoperative diagnosis: 20 millimeter bladder tumor  Postoperative diagnosis: Same  Principal procedure: Cystoscopy, TURBT of a 20 millimeter bladder tumor, placement of epirubicin postoperatively within the bladder.  Surgeon: Diona Fanti  Anesthesia: Gen. With LMA  Complications: None    specimen: 1.  Bladder tumor2.  Bladder tumor base.  Drains: 20 French Foley catheter to leg bag.  Indications: 75 year old male with hematuria, diagnosed with a 20 millimeter right posterior bladder wall tumor.  This is papillary in nature.  He presents at this time for cystoscopy, TURBT with probable placement of epirubicin postoperatively.  Risks and complications of the procedure have been discussed with the patient.  He understands these and desires to proceed.  Description of procedure: The patient was properly identified in the holding area.  He received preoperative IV antibiotics.  He was taken to the operating room where general anesthesia was administered with the LMA.  He was placed in the dorsolithotomy position.  Genitalia and perineum were prepped and draped.  Proper timeout was performed.  A 21 French panendoscope was advanced under direct vision through the urethra with the Foroblique lens.  This revealed a normal urethra with an obstructive prostate, trilobar hypertrophy noted with an intravesical median lobe.  The prostate was easily traversed.  The bladder was then inspected circumferentially.  The papillary tumor was seen approximately 1-2 centimeters above the right ureteral orifice just off the midline.  No other bladder lesions were seen.  Both ureteral orifices were normal in configuration and location.  Following careful inspection of the bladder, the cystoscope was removed.  Using the visual obturator, the 26 French resectoscope sheath was then advanced into the bladder.  The resectoscope was then placed with the small cutting loop.  The bladder tumor was resected by using the loop to  trim the base of the tumor from the inferior aspect of the bladder tumor, pushing the loop to gently cut the base of the tumor.  In this manner, the majority of the tumor was easily resected.  2-3 small papillary areas were present around the large stalk which had been resected.  The bladder tumor was then irrigated from the bladder and sent as "bladder tumor", to pathology.  I then used the cutting loop to resect the small papillary lesions, as well as get into the muscle layer of the bladder.  Once this area was resected, the fragments were irrigated from the bladder and sent, labeled "bladder tumor base".  At this point, the loop was used to cauterize the resected area.  Hemostasis was excellent at this point, with the exception of a small bleeder 2 on the median lobe of the prostate which was then cauterized as well.  Seeing adequate hemostasis.  Now, the scope was removed.  I passed a 3 [place] Foley catheter, and filled the balloon with 10 mL of water.  This was hooked to a leg bag.  Patient then was awakened and taken to the PACU in stable condition.  He tolerated the procedure well.  In the PACU, I placed 50 milligrams of epirubicin and 50 mL of diluent in the bladder which was left indwelling for 1 hour, then drained.

## 2016-04-13 ENCOUNTER — Encounter (HOSPITAL_BASED_OUTPATIENT_CLINIC_OR_DEPARTMENT_OTHER): Payer: Self-pay | Admitting: Urology

## 2016-05-17 ENCOUNTER — Other Ambulatory Visit: Payer: Self-pay | Admitting: Urology

## 2016-06-09 ENCOUNTER — Encounter (HOSPITAL_BASED_OUTPATIENT_CLINIC_OR_DEPARTMENT_OTHER): Payer: Self-pay | Admitting: *Deleted

## 2016-06-09 NOTE — Progress Notes (Signed)
NPO AFTER MN.  ARRIVE AT [id number].  NEEDS ISTAT 8.  CURRENT EKG IN CHART AND EPIC.  WILL TAKE METOPROLOL AND SYNTHROID AM DOS W/ SIPS OF WATER.

## 2016-06-14 ENCOUNTER — Ambulatory Visit (HOSPITAL_BASED_OUTPATIENT_CLINIC_OR_DEPARTMENT_OTHER): Payer: Medicare Other | Admitting: Anesthesiology

## 2016-06-14 ENCOUNTER — Encounter (HOSPITAL_BASED_OUTPATIENT_CLINIC_OR_DEPARTMENT_OTHER)
Admission: RE | Disposition: A | Discharge status: Home / Self Care | Payer: Self-pay | Source: Ambulatory Visit | Attending: Urology

## 2016-06-14 ENCOUNTER — Encounter (HOSPITAL_BASED_OUTPATIENT_CLINIC_OR_DEPARTMENT_OTHER): Payer: Self-pay | Admitting: *Deleted

## 2016-06-14 ENCOUNTER — Ambulatory Visit (HOSPITAL_BASED_OUTPATIENT_CLINIC_OR_DEPARTMENT_OTHER)
Admission: RE | Admit: 2016-06-14 | Discharge: 2016-06-14 | Disposition: A | Discharge status: Home / Self Care | Payer: Medicare Other | Source: Ambulatory Visit | Attending: Urology | Admitting: Urology

## 2016-06-14 DIAGNOSIS — I739 Peripheral vascular disease, unspecified: Secondary | ICD-10-CM | POA: Insufficient documentation

## 2016-06-14 DIAGNOSIS — I2510 Atherosclerotic heart disease of native coronary artery without angina pectoris: Secondary | ICD-10-CM | POA: Diagnosis not present

## 2016-06-14 DIAGNOSIS — M1990 Unspecified osteoarthritis, unspecified site: Secondary | ICD-10-CM | POA: Insufficient documentation

## 2016-06-14 DIAGNOSIS — Z951 Presence of aortocoronary bypass graft: Secondary | ICD-10-CM | POA: Insufficient documentation

## 2016-06-14 DIAGNOSIS — Z8551 Personal history of malignant neoplasm of bladder: Secondary | ICD-10-CM | POA: Insufficient documentation

## 2016-06-14 DIAGNOSIS — D303 Benign neoplasm of bladder: Secondary | ICD-10-CM | POA: Diagnosis not present

## 2016-06-14 DIAGNOSIS — C679 Malignant neoplasm of bladder, unspecified: Secondary | ICD-10-CM | POA: Diagnosis present

## 2016-06-14 DIAGNOSIS — Z87891 Personal history of nicotine dependence: Secondary | ICD-10-CM | POA: Diagnosis not present

## 2016-06-14 DIAGNOSIS — E785 Hyperlipidemia, unspecified: Secondary | ICD-10-CM | POA: Insufficient documentation

## 2016-06-14 DIAGNOSIS — I10 Essential (primary) hypertension: Secondary | ICD-10-CM | POA: Diagnosis not present

## 2016-06-14 DIAGNOSIS — E039 Hypothyroidism, unspecified: Secondary | ICD-10-CM | POA: Diagnosis not present

## 2016-06-14 HISTORY — PX: CYSTOSCOPY WITH BIOPSY: SHX5122

## 2016-06-14 LAB — POCT I-STAT, CHEM 8
BUN: 27 mg/dL — AB (ref 6–20)
CALCIUM ION: 1.19 mmol/L (ref 1.15–1.40)
Chloride: 106 mmol/L (ref 101–111)
Creatinine, Ser: 1.3 mg/dL — ABNORMAL HIGH (ref 0.61–1.24)
Glucose, Bld: 100 mg/dL — ABNORMAL HIGH (ref 65–99)
HCT: 38 % — ABNORMAL LOW (ref 39.0–52.0)
Hemoglobin: 12.9 g/dL — ABNORMAL LOW (ref 13.0–17.0)
Potassium: 4.1 mmol/L (ref 3.5–5.1)
SODIUM: 142 mmol/L (ref 135–145)
TCO2: 21 mmol/L (ref 0–100)

## 2016-06-14 SURGERY — CYSTOSCOPY, WITH BIOPSY
Anesthesia: General | Site: Bladder

## 2016-06-14 MED ORDER — CEFAZOLIN SODIUM-DEXTROSE 2-4 GM/100ML-% IV SOLN
INTRAVENOUS | Status: AC
  Filled 2016-06-14: qty 100

## 2016-06-14 MED ORDER — LIDOCAINE 2% (20 MG/ML) 5 ML SYRINGE
INTRAMUSCULAR | Status: AC
  Filled 2016-06-14: qty 5

## 2016-06-14 MED ORDER — FENTANYL CITRATE (PF) 100 MCG/2ML IJ SOLN
INTRAMUSCULAR | Status: AC
  Filled 2016-06-14: qty 2

## 2016-06-14 MED ORDER — FENTANYL CITRATE (PF) 100 MCG/2ML IJ SOLN
INTRAMUSCULAR | Status: DC | PRN
  Administered 2016-06-14 (×2): 50 ug via INTRAVENOUS

## 2016-06-14 MED ORDER — SULFAMETHOXAZOLE-TRIMETHOPRIM 800-160 MG PO TABS: 1.0000 | ORAL_TABLET | Freq: Two times a day (BID) | ORAL | 0 refills | Status: DC

## 2016-06-14 MED ORDER — EPHEDRINE 5 MG/ML INJ
INTRAVENOUS | Status: AC
  Filled 2016-06-14: qty 10

## 2016-06-14 MED ORDER — STERILE WATER FOR IRRIGATION IR SOLN
Status: DC | PRN
Start: 2016-06-14 — End: 2016-06-14
  Administered 2016-06-14: 3000 mL

## 2016-06-14 MED ORDER — PROMETHAZINE HCL 25 MG/ML IJ SOLN
6.2500 mg | INTRAMUSCULAR | Status: DC | PRN
  Filled 2016-06-14: qty 1

## 2016-06-14 MED ORDER — CEFAZOLIN IN D5W 1 GM/50ML IV SOLN
1.0000 g | INTRAVENOUS | Status: DC
  Filled 2016-06-14: qty 50

## 2016-06-14 MED ORDER — PROPOFOL 10 MG/ML IV BOLUS
INTRAVENOUS | Status: AC
Start: 2016-06-14 — End: 2016-06-14
  Filled 2016-06-14: qty 20

## 2016-06-14 MED ORDER — ONDANSETRON HCL 4 MG/2ML IJ SOLN
INTRAMUSCULAR | Status: DC | PRN
  Administered 2016-06-14: 4 mg via INTRAVENOUS

## 2016-06-14 MED ORDER — LIDOCAINE HCL (CARDIAC) 20 MG/ML IV SOLN
INTRAVENOUS | Status: DC | PRN
Start: 2016-06-14 — End: 2016-06-14
  Administered 2016-06-14: 80 mg via INTRAVENOUS

## 2016-06-14 MED ORDER — DEXAMETHASONE SODIUM PHOSPHATE 4 MG/ML IJ SOLN
INTRAMUSCULAR | Status: DC | PRN
  Administered 2016-06-14: 10 mg via INTRAVENOUS

## 2016-06-14 MED ORDER — DEXAMETHASONE SODIUM PHOSPHATE 10 MG/ML IJ SOLN
INTRAMUSCULAR | Status: AC
  Filled 2016-06-14: qty 1

## 2016-06-14 MED ORDER — PROPOFOL 10 MG/ML IV BOLUS
INTRAVENOUS | Status: DC | PRN
  Administered 2016-06-14: 150 mg via INTRAVENOUS

## 2016-06-14 MED ORDER — FENTANYL CITRATE (PF) 100 MCG/2ML IJ SOLN
25.0000 ug | INTRAMUSCULAR | Status: DC | PRN
  Administered 2016-06-14: 25 ug via INTRAVENOUS
  Filled 2016-06-14: qty 1

## 2016-06-14 MED ORDER — EPHEDRINE SULFATE-NACL 50-0.9 MG/10ML-% IV SOSY
PREFILLED_SYRINGE | INTRAVENOUS | Status: DC | PRN
  Administered 2016-06-14: 10 mg via INTRAVENOUS
  Administered 2016-06-14: 15 mg via INTRAVENOUS

## 2016-06-14 MED ORDER — ONDANSETRON HCL 4 MG/2ML IJ SOLN
INTRAMUSCULAR | Status: AC
  Filled 2016-06-14: qty 2

## 2016-06-14 MED ORDER — LACTATED RINGERS IV SOLN
INTRAVENOUS | Status: DC
  Administered 2016-06-14: 09:00:00 via INTRAVENOUS
  Filled 2016-06-14: qty 1000

## 2016-06-14 MED ORDER — CEFAZOLIN SODIUM-DEXTROSE 2-4 GM/100ML-% IV SOLN
2.0000 g | INTRAVENOUS | Status: AC
  Administered 2016-06-14: 2 g via INTRAVENOUS
  Filled 2016-06-14: qty 100

## 2016-06-14 SURGICAL SUPPLY — 27 items
BAG DRAIN URO-CYSTO SKYTR STRL (DRAIN) ×3 IMPLANT
BAG DRN ANRFLXCHMBR STRAP LEK (BAG) ×1
BAG DRN UROCATH (DRAIN) ×1
BAG URINE LEG 19OZ MD ST LTX (BAG) ×2 IMPLANT
CATH FOLEY 2WAY SLVR  5CC 20FR (CATHETERS) ×2
CATH FOLEY 2WAY SLVR 5CC 20FR (CATHETERS) IMPLANT
CLOTH BEACON ORANGE TIMEOUT ST (SAFETY) ×3 IMPLANT
ELECT REM PT RETURN 9FT ADLT (ELECTROSURGICAL) ×3
ELECTRODE REM PT RTRN 9FT ADLT (ELECTROSURGICAL) ×1 IMPLANT
GLOVE BIO SURGEON STRL SZ8 (GLOVE) ×3 IMPLANT
GLOVE ECLIPSE 7.0 STRL STRAW (GLOVE) ×2 IMPLANT
GLOVE INDICATOR 7.0 STRL GRN (GLOVE) ×2 IMPLANT
GOWN STRL REUS W/ TWL LRG LVL3 (GOWN DISPOSABLE) ×1 IMPLANT
GOWN STRL REUS W/ TWL XL LVL3 (GOWN DISPOSABLE) ×1 IMPLANT
GOWN STRL REUS W/TWL LRG LVL3 (GOWN DISPOSABLE)
GOWN STRL REUS W/TWL XL LVL3 (GOWN DISPOSABLE) ×4 IMPLANT
KIT ROOM TURNOVER WOR (KITS) ×3 IMPLANT
MANIFOLD NEPTUNE II (INSTRUMENTS) ×2 IMPLANT
NDL SAFETY ECLIPSE 18X1.5 (NEEDLE) IMPLANT
NEEDLE HYPO 18GX1.5 SHARP (NEEDLE)
NEEDLE HYPO 22GX1.5 SAFETY (NEEDLE) IMPLANT
NS IRRIG 500ML POUR BTL (IV SOLUTION) IMPLANT
PACK CYSTO (CUSTOM PROCEDURE TRAY) ×3 IMPLANT
SYR 20CC LL (SYRINGE) IMPLANT
TUBE CONNECTING 12'X1/4 (SUCTIONS) ×1
TUBE CONNECTING 12X1/4 (SUCTIONS) ×1 IMPLANT
WATER STERILE IRR 3000ML UROMA (IV SOLUTION) ×3 IMPLANT

## 2016-06-14 NOTE — Anesthesia Procedure Notes (Signed)
Procedure Name: LMA Insertion Date/Time: 06/14/2016 9:19 AM Performed by: Suzette Battiest Pre-anesthesia Checklist: Patient identified, Emergency Drugs available, Suction available and Patient being monitored Patient Re-evaluated:Patient Re-evaluated prior to inductionOxygen Delivery Method: Circle system utilized Preoxygenation: Pre-oxygenation with 100% oxygen Intubation Type: IV induction Ventilation: Mask ventilation without difficulty LMA: LMA inserted LMA Size: 5.0 Number of attempts: 1 Airway Equipment and Method: Bite block Placement Confirmation: positive ETCO2 Tube secured with: Tape Dental Injury: Teeth and Oropharynx as per pre-operative assessment

## 2016-06-14 NOTE — Discharge Instructions (Signed)
°  Post Anesthesia Home Care Instructions  Activity: Get plenty of rest for the remainder of the day. A responsible adult should stay with you for 24 hours following the procedure.  For the next 24 hours, DO NOT: -Drive a car -Paediatric nurse -Drink alcoholic beverages -Take any medication unless instructed by your physician -Make any legal decisions or sign important papers.  Meals: Start with liquid foods such as gelatin or soup. Progress to regular foods as tolerated. Avoid greasy, spicy, heavy foods. If nausea and/or vomiting occur, drink only clear liquids until the nausea and/or vomiting subsides. Call your physician if vomiting continues.  Special Instructions/Symptoms: Your throat may feel dry or sore from the anesthesia or the breathing tube placed in your throat during surgery. If this causes discomfort, gargle with warm salt water. The discomfort should disappear within 24 hours.  If you had a scopolamine patch placed behind your ear for the management of post- operative nausea and/or vomiting:  1. The medication in the patch is effective for 72 hours, after which it should be removed.  Wrap patch in a tissue and discard in the trash. Wash hands thoroughly with soap and water. 2. You may remove the patch earlier than 72 hours if you experience unpleasant side effects which may include dry mouth, dizziness or visual disturbances. 3. Avoid touching the patch. Wash your hands with soap and water after contact with the patch.   1. You may see some blood in the urine and may have some burning with urination for 48-72 hours. You also may notice that you have to urinate more frequently or urgently after your procedure which is normal.  2. You should call should you develop an inability urinate, fever > 101, persistent nausea and vomiting that prevents you from eating or drinking to stay hydrated. . 3. If you have a catheter, you will be taught how to take care of the catheter by the  nursing staff prior to discharge from the hospital.  You may periodically feel a strong urge to void with the catheter in place.  This is a bladder spasm and most often can occur when having a bowel movement or moving around. It is typically self-limited and usually will stop after a few minutes.  You may use some Vaseline or Neosporin around the tip of the catheter to reduce friction at the tip of the penis. You may also see some blood in the urine.  A very small amount of blood can make the urine look quite red.  As long as the catheter is draining well, there usually is not a problem.  It is okay to remove the catheter as instructed on Tuesday morning.  However, if the catheter is not draining well and is bloody, you should call the office [phone]) to notify us.

## 2016-06-14 NOTE — Transfer of Care (Signed)
  Last Vitals:  Vitals:   06/14/16 0807 06/14/16 0955  BP: (!) 153/54 (!) 151/55  Pulse: (!) 41 (!) 58  Resp: 18 (!) 7  Temp: 36.7 C 36.8 C    Last Pain:  Vitals:   06/14/16 0807  TempSrc: Oral         Immediate Anesthesia Transfer of Care Note  Patient: Andre Maldonado  Procedure(s) Performed: Procedure(s) (LRB): CYSTOSCOPY WITH BIOPSY (N/A)  Patient Location: PACU  Anesthesia Type: General  Level of Consciousness: awake, alert  and oriented  Airway & Oxygen Therapy: Patient Spontanous Breathing and Patient connected to nasal cannula oxygen  Post-op Assessment: Report given to PACU RN and Post -op Vital signs reviewed and stable  Post vital signs: Reviewed and stable  Complications: No apparent anesthesia complications

## 2016-06-14 NOTE — Anesthesia Preprocedure Evaluation (Addendum)
Anesthesia Evaluation  Patient identified by MRN, date of birth, ID band Patient awake    Reviewed: Allergy & Precautions, NPO status , Patient's Chart, lab work & pertinent test results  Airway Mallampati: II  TM Distance: >3 FB Neck ROM: Full    Dental  (+) Dental Advisory Given   Pulmonary former smoker,    breath sounds clear to auscultation       Cardiovascular hypertension, Pt. on medications and Pt. on home beta blockers + CAD, + CABG and + Peripheral Vascular Disease   Rhythm:Regular Rate:Normal     Neuro/Psych negative neurological ROS     GI/Hepatic negative GI ROS, Neg liver ROS,   Endo/Other  Hypothyroidism   Renal/GU negative Renal ROS     Musculoskeletal  (+) Arthritis ,   Abdominal   Peds  Hematology negative hematology ROS (+)   Anesthesia Other Findings   Reproductive/Obstetrics                            Lab Results  Component Value Date   WBC 10.2 02/19/2009   HGB 14.6 04/12/2016   HCT 43.0 04/12/2016   MCV 93.1 02/19/2009   PLT 234 02/19/2009   Lab Results  Component Value Date   CREATININE 1.00 04/12/2016   BUN 19 04/12/2016   NA 143 04/12/2016   K 4.0 04/12/2016   CL 107 04/12/2016   CO2 25 02/19/2009    Anesthesia Physical Anesthesia Plan  ASA: III  Anesthesia Plan: General   Post-op Pain Management:    Induction: Intravenous  Airway Management Planned: LMA  Additional Equipment:   Intra-op Plan:   Post-operative Plan: Extubation in OR  Informed Consent: I have reviewed the patients History and Physical, chart, labs and discussed the procedure including the risks, benefits and alternatives for the proposed anesthesia with the patient or authorized representative who has indicated his/her understanding and acceptance.   Dental advisory given  Plan Discussed with:   Anesthesia Plan Comments:         Anesthesia Quick Evaluation

## 2016-06-14 NOTE — Op Note (Signed)
Preoperative diagnosis: History of non-muscle invasive, high-grade papillary bladder cancer  Postoperative diagnosis: Same  Principal procedure: Cystoscopy, bladder biopsy of prior bladder tumor site,  Surgeon: Gean Laursen  Anesthesia: Gen. with LMA  Complications: None  Specimen: Biopsies from bladder tumor site, to pathology  Drains: 48 French Foley catheter, to leg bag.  Estimated blood loss: Less than 10 mL  Indications: 76 year old male status post TURBT of a 2 centimeter bladder cancer in December, 2017.  This revealed high-grade, non-muscle invasive papillary histology.  Epirubicin was placed postoperatively.  He presents at this time for cystoscopy and repeat bladder biopsy/TURBT 2 to the high-grade nature of his initial disease.  Description of procedure: The patient was properly identified in the holding area.  He received preoperative IV antibiotics.  He was then taken to the operating room where general anesthetic was administered with the LMA.  He was placed in the dorsolithotomy position.  Genitalia and perineum were prepped and draped.  Proper timeout was performed.  A 21 French panendoscope was advanced under direct vision through his urethra.  Mild bulbous urethral stricture which was easily traversed with the beak of the scope.  Prostatic urethra was nonobstructive.  The bladder was then entered and inspected circumferentially.  Both with the 12 and the 70 lenses.  This revealed prior biopsy site just superior to the right ureteral orifice with one small papillary lesion within it.  There were no other lesions noted within this area.  No other urothelial lesions were noted.  Ureteral orifices were normal in configuration and location.  Following circumferential inspection, I then placed the 30 lens with the cold cup biopsy forceps.  The entire old biopsy site was excised with the cold cup forceps.  These were labeled "bladder tumor site".  They were sent for permanent  specimen.  Following this, the entire biopsy site was cauterized with electrocautery/the Bugbee electrode until hemostasis was achieved.  At this point, the scope was removed after the bladder drained.  A 20 French Foley catheter was placed in the balloon was filled with 10 mL of water.  Was hooked to dependent drainage.  The patient tolerated the procedure well.  He was then awakened and taken to the PACU in stable condition.

## 2016-06-14 NOTE — Anesthesia Postprocedure Evaluation (Signed)
Anesthesia Post Note  Patient: Para Skeans  Procedure(s) Performed: Procedure(s) (LRB): CYSTOSCOPY WITH BIOPSY (N/A)  Patient location during evaluation: PACU Anesthesia Type: General Level of consciousness: awake and alert Pain management: pain level controlled Vital Signs Assessment: post-procedure vital signs reviewed and stable Respiratory status: spontaneous breathing, nonlabored ventilation, respiratory function stable and patient connected to nasal cannula oxygen Cardiovascular status: blood pressure returned to baseline and stable Postop Assessment: no signs of nausea or vomiting Anesthetic complications: no       Last Vitals:  Vitals:   06/14/16 1100 06/14/16 1136  BP:  (!) 161/62  Pulse: (!) 42 (!) 43  Resp: 12 12  Temp:  36.4 C    Last Pain:  Vitals:   06/14/16 1136  TempSrc:   PainSc: 0-No pain                 Tiajuana Amass

## 2016-06-14 NOTE — H&P (Signed)
Urology History and Physical Exam  CC: Bladder cancer  HPI: 76 year old male status post TURBT on 04/12/2016.  He was found to have a high-grade, non-muscle invasive bladder cancer.  He had no problems after that resection.  He was administered epirubicin.  Following that procedure.  The patient presents at this time for repeat cystoscopy/TURBT as a second look procedure due to his high-grade histology.  PMH: Past Medical History:  Diagnosis Date  . Arthritis    hands, elbows, knees  . Bladder cancer [facility])    urologist-  [name]  . Coronary arteriosclerosis in native artery    s/p  cabg x1  1981--  Cardiologist-- dr [name]  LOV 02-05-2009  . Hyperlipidemia   . Hypertension   . Hypothyroidism   . Nocturia   . Peripheral vascular disease ([place])    per duplex 03-05-2014  occluded left  SFA  distal level reconstitution at the popliteal  . 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 Wears dentures    upper  . Wears glasses     PSH: Past Surgical History:  Procedure Laterality Date  . ANTERIOR CERVICAL DECOMP/DISCECTOMY FUSION  02/19/2009   C5 -- C6  . CORONARY ARTERY BYPASS GRAFT  1981   in [place] , [place]   x1  LIMA to LAD  . KNEE SURGERY Left 1976  . TRANSURETHRAL RESECTION OF BLADDER TUMOR N/A 04/12/2016   Procedure: TRANSURETHRAL RESECTION OF BLADDER TUMOR (TURBT) WITH EPIRUBICIN  INSTILLATION;  Surgeon: [name], MD;  Location: [facility];  Service: Urology;  Laterality: N/A;    Allergies: No Known Allergies  Medications: Prescriptions Prior to Admission  Medication Sig Dispense Refill Last Dose  . levothyroxine (SYNTHROID, LEVOTHROID) 100 MCG tablet Take 100 mcg by mouth daily before breakfast.   04/12/2016 at 0600  . losartan-hydrochlorothiazide (HYZAAR) 100-12.5 MG tablet Take 1 tablet by mouth every morning.   Past Month at Unknown time  . metoprolol (LOPRESSOR) 50 MG tablet Take 25 mg by mouth 2 (two) times daily.    04/12/2016 at 0600  .  Multiple Vitamins-Minerals (CENTRUM SILVER 50+MEN PO) Take 1 tablet by mouth daily.   Past Month at Unknown time  . simvastatin (ZOCOR) 20 MG tablet Take 20 mg by mouth every morning.    Past Month at Unknown time     Social History: Social History   Social History  . Marital status: Married    Spouse name: N/A  . Number of children: N/A  . Years of education: N/A   Occupational History  . Not on file.   Social History Main Topics  . Smoking status: Former Smoker    Years: 30.00    Types: Cigarettes    Quit date: 04/08/1985  . Smokeless tobacco: Never Used  . Alcohol use Yes     Comment: occasional  . Drug use: No  . Sexual activity: Not on file   Other Topics Concern  . Not on file   Social History Narrative  . No narrative on file    Family History: History reviewed. No pertinent family history.  GU Review Male: Patient reports get up at night to urinate. Patient denies frequent urination, hard to postpone urination, burning/ pain with urination, leakage of urine, stream starts and stops, trouble starting your stream, have to strain to urinate , erection problems, and penile pain. Gastrointestinal (Upper): Patient denies nausea, vomiting, and indigestion/ heartburn. Gastrointestinal (Lower): Patient denies diarrhea and  constipation. Constitutional: Patient denies fever, night sweats, weight loss, and fatigue. Skin: Patient denies skin rash/ lesion and itching. Eyes: Patient denies blurred vision and double vision. Ears/ Nose/ Throat: Patient denies sore throat and sinus problems. Hematologic/Lymphatic: Patient denies swollen glands and easy bruising. Cardiovascular: Patient denies leg swelling and chest pains. Respiratory: Patient denies cough and shortness of breath. Endocrine: Patient denies excessive thirst. Musculoskeletal: Patient denies back pain and joint pain. Neurological: Patient denies headaches and dizziness. Psychologic: Patient denies depression and anxiety.              Physical Exam: @VITALS2 @ General: No acute distress.  Awake. Head:  Normocephalic.  Atraumatic. ENT:  EOMI.  Mucous membranes moist Neck:  Supple.  No lymphadenopathy. CV:  S1 present. S2 present. Regular rate. Pulmonary: Equal effort bilaterally.  Clear to auscultation bilaterally. Abdomen: Soft.  Non- tender to palpation. Skin:  Normal turgor.  No visible rash. Extremity: No gross deformity of bilateral upper extremities.  No gross deformity of                             lower extremities. Neurologic: Alert. Appropriate mood.    Studies:  No results for input(s): HGB, WBC, PLT in the last 72 hours.  No results for input(s): NA, K, CL, CO2, BUN, CREATININE, CALCIUM, GFRNONAA, GFRAA in the last 72 hours.  Invalid input(s): MAGNESIUM   No results for input(s): INR, APTT in the last 72 hours.  Invalid input(s): PT   Invalid input(s): ABG    Assessment:  History of non-muscle invasive, high-grade bladder cancer, status post initial resection on 04/12/2016  Plan: Repeat/second look resection.

## 2016-06-15 ENCOUNTER — Encounter (HOSPITAL_BASED_OUTPATIENT_CLINIC_OR_DEPARTMENT_OTHER): Payer: Self-pay | Admitting: Urology

## 2017-07-22 ENCOUNTER — Ambulatory Visit (HOSPITAL_COMMUNITY)
Admission: RE | Admit: 2017-07-22 | Discharge: 2017-07-22 | Disposition: A | Discharge status: Home / Self Care | Payer: Medicare Other | Source: Ambulatory Visit | Attending: Internal Medicine | Admitting: Internal Medicine

## 2017-07-22 ENCOUNTER — Other Ambulatory Visit (HOSPITAL_COMMUNITY): Payer: Self-pay | Admitting: Internal Medicine

## 2017-07-22 DIAGNOSIS — K5732 Diverticulitis of large intestine without perforation or abscess without bleeding: Secondary | ICD-10-CM | POA: Insufficient documentation

## 2017-07-22 DIAGNOSIS — R1013 Epigastric pain: Secondary | ICD-10-CM

## 2017-07-22 DIAGNOSIS — N329 Bladder disorder, unspecified: Secondary | ICD-10-CM | POA: Insufficient documentation

## 2017-07-22 MED ORDER — IOPAMIDOL (ISOVUE-300) INJECTION 61%
100.0000 mL | Freq: Once | INTRAVENOUS | Status: AC | PRN
  Administered 2017-07-22: 100 mL via INTRAVENOUS

## 2017-07-22 MED ORDER — IOPAMIDOL (ISOVUE-300) INJECTION 61%
INTRAVENOUS | Status: AC
  Filled 2017-07-22: qty 30

## 2017-07-22 MED ORDER — IOPAMIDOL (ISOVUE-300) INJECTION 61%
INTRAVENOUS | Status: AC
  Filled 2017-07-22: qty 100

## 2017-07-22 MED ORDER — IOPAMIDOL (ISOVUE-300) INJECTION 61%
30.0000 mL | Freq: Once | INTRAVENOUS | Status: AC | PRN
  Administered 2017-07-22: 30 mL via ORAL

## 2017-07-27 ENCOUNTER — Other Ambulatory Visit: Payer: Self-pay

## 2017-07-27 ENCOUNTER — Emergency Department (HOSPITAL_COMMUNITY): Payer: Medicare Other

## 2017-07-27 ENCOUNTER — Emergency Department (HOSPITAL_COMMUNITY)
Admission: EM | Admit: 2017-07-27 | Discharge: 2017-07-27 | Disposition: A | Discharge status: Home / Self Care | Payer: Medicare Other | Attending: Emergency Medicine | Admitting: Emergency Medicine

## 2017-07-27 ENCOUNTER — Encounter (HOSPITAL_COMMUNITY): Payer: Self-pay

## 2017-07-27 DIAGNOSIS — Z79899 Other long term (current) drug therapy: Secondary | ICD-10-CM | POA: Insufficient documentation

## 2017-07-27 DIAGNOSIS — R1032 Left lower quadrant pain: Secondary | ICD-10-CM | POA: Diagnosis present

## 2017-07-27 DIAGNOSIS — Z87891 Personal history of nicotine dependence: Secondary | ICD-10-CM | POA: Insufficient documentation

## 2017-07-27 DIAGNOSIS — Z951 Presence of aortocoronary bypass graft: Secondary | ICD-10-CM | POA: Diagnosis not present

## 2017-07-27 DIAGNOSIS — K5792 Diverticulitis of intestine, part unspecified, without perforation or abscess without bleeding: Secondary | ICD-10-CM

## 2017-07-27 DIAGNOSIS — I2510 Atherosclerotic heart disease of native coronary artery without angina pectoris: Secondary | ICD-10-CM | POA: Diagnosis not present

## 2017-07-27 DIAGNOSIS — E039 Hypothyroidism, unspecified: Secondary | ICD-10-CM | POA: Insufficient documentation

## 2017-07-27 DIAGNOSIS — I10 Essential (primary) hypertension: Secondary | ICD-10-CM | POA: Insufficient documentation

## 2017-07-27 LAB — CBC
HCT: 39.4 % (ref 39.0–52.0)
Hemoglobin: 14.2 g/dL (ref 13.0–17.0)
MCH: 32.7 pg (ref 26.0–34.0)
MCHC: 36 g/dL (ref 30.0–36.0)
MCV: 90.8 fL (ref 78.0–100.0)
PLATELETS: 259 10*3/uL (ref 150–400)
RBC: 4.34 MIL/uL (ref 4.22–5.81)
RDW: 12.3 % (ref 11.5–15.5)
WBC: 16.9 10*3/uL — AB (ref 4.0–10.5)

## 2017-07-27 LAB — COMPREHENSIVE METABOLIC PANEL
ALK PHOS: 127 U/L — AB (ref 38–126)
ALT: 30 U/L (ref 17–63)
AST: 31 U/L (ref 15–41)
Albumin: 3.7 g/dL (ref 3.5–5.0)
Anion gap: 10 (ref 5–15)
BUN: 12 mg/dL (ref 6–20)
CO2: 22 mmol/L (ref 22–32)
CREATININE: 1.27 mg/dL — AB (ref 0.61–1.24)
Calcium: 9.2 mg/dL (ref 8.9–10.3)
Chloride: 105 mmol/L (ref 101–111)
GFR, EST NON AFRICAN AMERICAN: 52 mL/min — AB (ref >60)
Glucose, Bld: 132 mg/dL — ABNORMAL HIGH (ref 65–99)
Potassium: 4.3 mmol/L (ref 3.5–5.1)
Sodium: 137 mmol/L (ref 135–145)
TOTAL PROTEIN: 7.1 g/dL (ref 6.5–8.1)
Total Bilirubin: 0.9 mg/dL (ref 0.3–1.2)

## 2017-07-27 LAB — URINALYSIS, ROUTINE W REFLEX MICROSCOPIC
Bilirubin Urine: NEGATIVE
GLUCOSE, UA: NEGATIVE mg/dL
Hgb urine dipstick: NEGATIVE
KETONES UR: NEGATIVE mg/dL
LEUKOCYTES UA: NEGATIVE
NITRITE: NEGATIVE
Protein, ur: NEGATIVE mg/dL
Specific Gravity, Urine: 1.01 (ref 1.005–1.030)
pH: 5 (ref 5.0–8.0)

## 2017-07-27 LAB — LIPASE, BLOOD: Lipase: 27 U/L (ref 11–51)

## 2017-07-27 MED ORDER — ONDANSETRON HCL 4 MG/2ML IJ SOLN
4.0000 mg | Freq: Once | INTRAMUSCULAR | Status: AC
  Administered 2017-07-27: 4 mg via INTRAVENOUS
  Filled 2017-07-27 (×2): qty 2

## 2017-07-27 MED ORDER — SODIUM CHLORIDE 0.9 % IV BOLUS (SEPSIS)
500.0000 mL | Freq: Once | INTRAVENOUS | Status: AC
  Administered 2017-07-27: 500 mL via INTRAVENOUS

## 2017-07-27 MED ORDER — CIPROFLOXACIN HCL 500 MG PO TABS: 500.0000 mg | ORAL_TABLET | Freq: Two times a day (BID) | ORAL | 0 refills | Status: DC

## 2017-07-27 MED ORDER — MORPHINE SULFATE (PF) 4 MG/ML IV SOLN
4.0000 mg | Freq: Once | INTRAVENOUS | Status: AC
  Administered 2017-07-27: 4 mg via INTRAVENOUS
  Filled 2017-07-27 (×2): qty 1

## 2017-07-27 MED ORDER — IOPAMIDOL (ISOVUE-300) INJECTION 61%
INTRAVENOUS | Status: AC
  Administered 2017-07-27: 100 mL
  Filled 2017-07-27: qty 100

## 2017-07-27 MED ORDER — METRONIDAZOLE 500 MG PO TABS: 500.0000 mg | ORAL_TABLET | Freq: Two times a day (BID) | ORAL | 0 refills | Status: DC

## 2017-07-27 MED ORDER — TRAMADOL HCL 50 MG PO TABS: 50.0000 mg | ORAL_TABLET | Freq: Four times a day (QID) | ORAL | 0 refills | Status: DC | PRN

## 2017-07-27 NOTE — ED Triage Notes (Signed)
Pt presents to the ed with complaints of abdominal pain x 1 week. States he was diagnosed with diverticulitis at [facility] last week and given antibiotics, he started having a stabbing pain this morning so he came to get checked out.

## 2017-07-27 NOTE — Discharge Instructions (Signed)
You were seen here today for abdominal pain.  Your CT scan showed you have diverticulitis.  Please stop your previous antibiotic and begin taking your new antibiotics.  These have been sent to the pharmacy.  Please see attached handout on diverticulitis.  These follow-up with your GI doctor in 1 week.  Please return for fever, worsening abdominal pain, bloody stools, or other concerning symptoms.

## 2017-07-27 NOTE — ED Notes (Signed)
Marya Amsler RN attempted IV X2 LAC and L Hand, Torianne Laflam RN attempted IV RAC. Unable to obtain. IV Team consult placed.

## 2017-07-27 NOTE — ED Provider Notes (Signed)
  Face-to-face evaluation   History: He presents for evaluation of worsening abdominal pain despite being treated for diverticulitis with oral antibiotics.  Physical exam: Alert, calm, cooperative.  No respiratory distress.  Abdomen, soft and mildly tender left lower quadrant.  No rebound tenderness.  Patient is nontoxic in appearance.  Medical screening examination/treatment/procedure(s) were conducted as a shared visit with non-physician practitioner(s) and myself.  I personally evaluated the patient during the encounter   Daleen Bo, MD 07/27/17 [phone]

## 2017-07-27 NOTE — ED Provider Notes (Signed)
[facility] EMERGENCY DEPARTMENT Provider Note   CSN: [id number] Arrival date & time: 07/27/17  1028     History   Chief Complaint Chief Complaint  Patient presents with  . Abdominal Pain    HPI Andre Maldonado is a 77 y.o. male with a history of hypertension, hyperlipidemia, bladder cancer status post surgery who presents emergency department today for left lower quadrant abdominal pain.  Patient states on 13 March he started having periumbilical abdominal pain and went and saw his PCP.  He had outpatient CT scan that showed diverticulitis of the sigmoid colon.  He denies history of diverticulitis prior to this.  Patient has had prior colonoscopy 4 years ago that he states was "clean" and was told he no longer needs further colonoscopies in the future.  He is unsure who performed this.  He was prescribed Nexium and Augmentin and and discharged home.  He notes that over the last 3 days he has had non-melanous, not bloody diarrhea but has not had any associated abdominal pain.  He states when he woke today he had severe abdominal pain in the left lower quadrant that is worse with movement and also palpation.  He notes he is no longer passing gas and has not had a bowel movement since yesterday.  He reports secondary to the pain he is not been able to tolerate p.o. fluids since waking up this morning.  He reports some associated nausea but no emesis.  The patient is not taking anything for this at home.  He denies any associated fever, chills, chest pain, shortness of breath, urinary symptoms, or emesis.  Patient has been taking his antibiotics as prescribed and has 3 pills left.  HPI  Past Medical History:  Diagnosis Date  . Arthritis    hands, elbows, knees  . Bladder cancer [facility]    urologist-  [name]  . Coronary arteriosclerosis in native artery    s/p  cabg x1  1981--  Cardiologist-- dr [name]  LOV 02-05-2009  . Hyperlipidemia   . Hypertension   .  Hypothyroidism   . Nocturia   . Peripheral vascular disease ([place])    per duplex 03-05-2014  occluded left  SFA  distal level reconstitution at the popliteal  . 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 Wears dentures    upper  . Wears glasses     Patient Active Problem List   Diagnosis Date Noted  . HYPOTHYROIDISM 09/23/2006  . HYPERLIPIDEMIA 09/23/2006  . HYPERTENSION 09/23/2006  . CORONARY ARTERY DISEASE 09/23/2006    Past Surgical History:  Procedure Laterality Date  . ANTERIOR CERVICAL DECOMP/DISCECTOMY FUSION  02/19/2009   C5 -- C6  . CORONARY ARTERY BYPASS GRAFT  1981   in [place] , [place]   x1  LIMA to LAD  . CYSTOSCOPY WITH BIOPSY N/A 06/14/2016   Procedure: CYSTOSCOPY WITH BIOPSY;  Surgeon: [name], MD;  Location: [facility];  Service: Urology;  Laterality: N/A;  . KNEE SURGERY Left 1976  . TRANSURETHRAL RESECTION OF BLADDER TUMOR N/A 04/12/2016   Procedure: TRANSURETHRAL RESECTION OF BLADDER TUMOR (TURBT) WITH EPIRUBICIN  INSTILLATION;  Surgeon: [name], MD;  Location: [facility];  Service: Urology;  Laterality: N/A;       Home Medications    Prior to Admission medications   Medication Sig Start Date End Date Taking? Authorizing Provider  levothyroxine (SYNTHROID, LEVOTHROID) 100 MCG tablet Take 100 mcg  by mouth daily before breakfast.    [provider]  losartan-hydrochlorothiazide (HYZAAR) 100-12.5 MG tablet Take 1 tablet by mouth every morning.    [provider]  metoprolol (LOPRESSOR) 50 MG tablet Take 25 mg by mouth 2 (two) times daily.     [provider]  Multiple Vitamins-Minerals (CENTRUM SILVER 50+MEN PO) Take 1 tablet by mouth daily.    [provider]  simvastatin (ZOCOR) 20 MG tablet Take 20 mg by mouth every morning.     [provider]  sulfamethoxazole-trimethoprim (BACTRIM DS,SEPTRA DS) 800-160 MG tablet Take 1 tablet by mouth 2 (two) times daily. 06/14/16    [name], MD    Family History No family history on file.  Social History Social History   Tobacco Use  . Smoking status: Former Smoker    Years: 30.00    Types: Cigarettes    Last attempt to quit: 04/08/1985    Years since quitting: 32.3  . Smokeless tobacco: Never Used  Substance Use Topics  . Alcohol use: Yes    Comment: occasional  . Drug use: No     Allergies   Patient has no known allergies.   Review of Systems Review of Systems  All other systems reviewed and are negative.    Physical Exam Updated Vital Signs BP (!) 130/58   Pulse (!) 54   Temp 98.4 F (36.9 C) (Oral)   Resp 16   SpO2 99%   Physical Exam  Constitutional: He appears well-developed and well-nourished.  HENT:  Head: Normocephalic and atraumatic.  Right Ear: External ear normal.  Left Ear: External ear normal.  Nose: Nose normal.  Mouth/Throat: Uvula is midline, oropharynx is clear and moist and mucous membranes are normal. No tonsillar exudate.  Eyes: Pupils are equal, round, and reactive to light. Right eye exhibits no discharge. Left eye exhibits no discharge. No scleral icterus.  Neck: Trachea normal. Neck supple. No spinous process tenderness present. No neck rigidity. Normal range of motion present.  Cardiovascular: Normal rate, regular rhythm and intact distal pulses.  No murmur heard. Pulses:      Radial pulses are 2+ on the right side, and 2+ on the left side.       Dorsalis pedis pulses are 2+ on the right side, and 2+ on the left side.       Posterior tibial pulses are 2+ on the right side, and 2+ on the left side.  No lower extremity swelling or edema. Calves symmetric in size bilaterally.  Pulmonary/Chest: Effort normal and breath sounds normal. He exhibits no tenderness.  Abdominal: Soft. Bowel sounds are normal. He exhibits no distension. There is tenderness in the left lower quadrant. There is no rigidity, no rebound, no guarding and no CVA tenderness.    Musculoskeletal: He exhibits no edema.  Lymphadenopathy:    He has no cervical adenopathy.  Neurological: He is alert.  Skin: Skin is warm and dry. No rash noted. He is not diaphoretic.  Psychiatric: He has a normal mood and affect.  Nursing note and vitals reviewed.    ED Treatments / Results  Labs (all labs ordered are listed, but only abnormal results are displayed) Labs Reviewed  COMPREHENSIVE METABOLIC PANEL - Abnormal; Notable for the following components:      Result Value   Glucose, Bld 132 (*)    Creatinine, Ser 1.27 (*)    Alkaline Phosphatase 127 (*)    GFR calc non Af Amer 52 (*)  All other components within normal limits  CBC - Abnormal; Notable for the following components:   WBC 16.9 (*)    All other components within normal limits  LIPASE, BLOOD  URINALYSIS, ROUTINE W REFLEX MICROSCOPIC    EKG  EKG Interpretation None       Radiology No results found.  Procedures Procedures (including critical care time)  Medications Ordered in ED Medications  sodium chloride 0.9 % bolus 500 mL (500 mLs Intravenous New Bag/Given 07/27/17 1541)  ondansetron (ZOFRAN) injection 4 mg (4 mg Intravenous Given 07/27/17 1536)  morphine 4 MG/ML injection 4 mg (4 mg Intravenous Given 07/27/17 1536)  iopamidol (ISOVUE-300) 61 % injection (100 mLs  Contrast Given 07/27/17 1601)     Initial Impression / Assessment and Plan / ED Course  I have reviewed the triage vital signs and the nursing notes.  Pertinent labs & imaging results that were available during my care of the patient were reviewed by me and considered in my medical decision making (see chart for details).     77 y.o. male with a history of hypertension, hyperlipidemia, bladder cancer status post surgery who presents emergency department today for left lower quadrant abdominal pain.  Patient states on 13 March he started having periumbilical abdominal pain and went and saw his PCP.  He had outpatient CT scan that  showed diverticulitis of the sigmoid colon.  He denies history of diverticulitis prior to this.  Patient has had prior colonoscopy 4 years ago that he states was "clean" and was told he no longer needs further colonoscopies in the future.  He is unsure who performed this.  He was prescribed Nexium and Augmentin and and discharged home.  He notes that over the last 3 days he has had non-melanous, not bloody diarrhea but has not had any associated abdominal pain.  He states when he woke today he had severe abdominal pain in the left lower quadrant that is worse with movement and also palpation.  He notes he is no longer passing gas and has not had a bowel movement since yesterday.  He reports secondary to the pain he is not been able to tolerate p.o. fluids since waking up this morning.  He reports some associated nausea but no emesis.  The patient is not taking anything for this at home.  He denies any associated fever, chills, chest pain, shortness of breath, urinary symptoms, or emesis.  Patient has been taking his antibiotics as prescribed and has 3 pills left.  Patient vital signs are reassuring on presentation.  No tachycardia, tachypnea, hypoxia or hypotension.  He is non-ill and nonseptic appearing.  On exam the patient does have significant tenderness to the left lower quadrant.  There is no peritoneal signs.  Labs done in triage.  This is significant for leukocytosis of 16.9.  Patient's creatinine near baseline.  He has an elevated alk phos of 127 but labs otherwise unremarkable.  UA without evidence of infection.  Feel the patient will need repeat CT imaging in order to rule out a developing abscess as pain has recurred.  IV fluids, nausea medication and pain medication.  Patient CT scan shows mild increased inflammation associated with the distal descending/proximal sigmoid colon diverticulitis.  There is no abscess evident.  Repeat abdominal exam without peritoneal signs.  Will change the patient's  antibiotics to Cipro and Flagyl.  Adivsed the patient not to drink alcohol while taking Flagyl.  Will provide short course of pain medication at home.  Patient  is to follow-up with GI specialist. He is to follow up with his PCP otherwise. Specific return precautions discussed. Time was given for all questions to be answered. The patient verbalized understanding and agreement with plan. The patient appears safe for discharge home.  Patient case seen and discussed with Dr. Eulis Foster who is in agreement with plan.   Final Clinical Impressions(s) / ED Diagnoses   Final diagnoses:  Diverticulitis    ED Discharge Orders        Ordered    traMADol (ULTRAM) 50 MG tablet  Every 6 hours PRN     07/27/17 1659    ciprofloxacin (CIPRO) 500 MG tablet  2 times daily     07/27/17 1659    metroNIDAZOLE (FLAGYL) 500 MG tablet  2 times daily with meals     07/27/17 1659       Lorelle Gibbs 07/27/17 1727    Daleen Bo, MD 07/27/17 [phone]

## 2017-07-27 NOTE — ED Notes (Signed)
ED Provider at bedside. 

## 2017-08-19 ENCOUNTER — Emergency Department (HOSPITAL_COMMUNITY): Payer: Medicare Other

## 2017-08-19 ENCOUNTER — Emergency Department (HOSPITAL_COMMUNITY)
Admission: EM | Admit: 2017-08-19 | Discharge: 2017-08-20 | Disposition: A | Discharge status: Home / Self Care | Payer: Medicare Other | Attending: Emergency Medicine | Admitting: Emergency Medicine

## 2017-08-19 ENCOUNTER — Encounter (HOSPITAL_COMMUNITY): Payer: Self-pay

## 2017-08-19 ENCOUNTER — Other Ambulatory Visit: Payer: Self-pay

## 2017-08-19 DIAGNOSIS — R197 Diarrhea, unspecified: Secondary | ICD-10-CM | POA: Diagnosis not present

## 2017-08-19 DIAGNOSIS — E039 Hypothyroidism, unspecified: Secondary | ICD-10-CM | POA: Insufficient documentation

## 2017-08-19 DIAGNOSIS — Z87891 Personal history of nicotine dependence: Secondary | ICD-10-CM | POA: Insufficient documentation

## 2017-08-19 DIAGNOSIS — Z951 Presence of aortocoronary bypass graft: Secondary | ICD-10-CM | POA: Diagnosis not present

## 2017-08-19 DIAGNOSIS — I2510 Atherosclerotic heart disease of native coronary artery without angina pectoris: Secondary | ICD-10-CM | POA: Diagnosis not present

## 2017-08-19 DIAGNOSIS — Z8551 Personal history of malignant neoplasm of bladder: Secondary | ICD-10-CM | POA: Insufficient documentation

## 2017-08-19 DIAGNOSIS — Z79899 Other long term (current) drug therapy: Secondary | ICD-10-CM | POA: Diagnosis not present

## 2017-08-19 DIAGNOSIS — R112 Nausea with vomiting, unspecified: Secondary | ICD-10-CM | POA: Insufficient documentation

## 2017-08-19 DIAGNOSIS — R109 Unspecified abdominal pain: Secondary | ICD-10-CM

## 2017-08-19 DIAGNOSIS — I10 Essential (primary) hypertension: Secondary | ICD-10-CM | POA: Diagnosis not present

## 2017-08-19 DIAGNOSIS — R1013 Epigastric pain: Secondary | ICD-10-CM | POA: Diagnosis present

## 2017-08-19 LAB — URINALYSIS, ROUTINE W REFLEX MICROSCOPIC
Bilirubin Urine: NEGATIVE
Glucose, UA: NEGATIVE mg/dL
HGB URINE DIPSTICK: NEGATIVE
KETONES UR: NEGATIVE mg/dL
Leukocytes, UA: NEGATIVE
Nitrite: NEGATIVE
PROTEIN: NEGATIVE mg/dL
Specific Gravity, Urine: 1.021 (ref 1.005–1.030)
pH: 5 (ref 5.0–8.0)

## 2017-08-19 LAB — COMPREHENSIVE METABOLIC PANEL
ALK PHOS: 141 U/L — AB (ref 38–126)
ALT: 33 U/L (ref 17–63)
AST: 36 U/L (ref 15–41)
Albumin: 3.9 g/dL (ref 3.5–5.0)
Anion gap: 14 (ref 5–15)
BUN: 20 mg/dL (ref 6–20)
CALCIUM: 9.2 mg/dL (ref 8.9–10.3)
CHLORIDE: 100 mmol/L — AB (ref 101–111)
CO2: 21 mmol/L — ABNORMAL LOW (ref 22–32)
CREATININE: 1.43 mg/dL — AB (ref 0.61–1.24)
GFR calc Af Amer: 53 mL/min — ABNORMAL LOW (ref >60)
GFR, EST NON AFRICAN AMERICAN: 45 mL/min — AB (ref >60)
Glucose, Bld: 133 mg/dL — ABNORMAL HIGH (ref 65–99)
Potassium: 3.1 mmol/L — ABNORMAL LOW (ref 3.5–5.1)
Sodium: 135 mmol/L (ref 135–145)
Total Bilirubin: 0.8 mg/dL (ref 0.3–1.2)
Total Protein: 7.6 g/dL (ref 6.5–8.1)

## 2017-08-19 LAB — CBC
HCT: 40.8 % (ref 39.0–52.0)
Hemoglobin: 14.5 g/dL (ref 13.0–17.0)
MCH: 32.1 pg (ref 26.0–34.0)
MCHC: 35.5 g/dL (ref 30.0–36.0)
MCV: 90.3 fL (ref 78.0–100.0)
PLATELETS: 281 10*3/uL (ref 150–400)
RBC: 4.52 MIL/uL (ref 4.22–5.81)
RDW: 12.6 % (ref 11.5–15.5)
WBC: 13.4 10*3/uL — AB (ref 4.0–10.5)

## 2017-08-19 LAB — LIPASE, BLOOD: LIPASE: 36 U/L (ref 11–51)

## 2017-08-19 MED ORDER — IOPAMIDOL (ISOVUE-300) INJECTION 61%
100.0000 mL | Freq: Once | INTRAVENOUS | Status: AC | PRN
  Administered 2017-08-19: 80 mL via INTRAVENOUS

## 2017-08-19 MED ORDER — MORPHINE SULFATE (PF) 4 MG/ML IV SOLN
4.0000 mg | Freq: Once | INTRAVENOUS | Status: AC
  Administered 2017-08-19: 4 mg via INTRAVENOUS
  Filled 2017-08-19: qty 1

## 2017-08-19 MED ORDER — ONDANSETRON HCL 4 MG/2ML IJ SOLN
4.0000 mg | Freq: Once | INTRAMUSCULAR | Status: AC
  Administered 2017-08-19: 4 mg via INTRAVENOUS
  Filled 2017-08-19: qty 2

## 2017-08-19 MED ORDER — HYDROMORPHONE HCL 1 MG/ML IJ SOLN
1.0000 mg | Freq: Once | INTRAMUSCULAR | Status: AC
  Administered 2017-08-19: 1 mg via INTRAVENOUS
  Filled 2017-08-19: qty 1

## 2017-08-19 MED ORDER — POTASSIUM CHLORIDE CRYS ER 20 MEQ PO TBCR: 20.0000 meq | EXTENDED_RELEASE_TABLET | Freq: Every day | ORAL | 0 refills | Status: DC

## 2017-08-19 MED ORDER — SODIUM CHLORIDE 0.9 % IV BOLUS
1000.0000 mL | Freq: Once | INTRAVENOUS | Status: AC
  Administered 2017-08-19: 1000 mL via INTRAVENOUS

## 2017-08-19 MED ORDER — ONDANSETRON 4 MG PO TBDP: 4.0000 mg | ORAL_TABLET | Freq: Three times a day (TID) | ORAL | 0 refills | Status: DC | PRN

## 2017-08-19 MED ORDER — IOPAMIDOL (ISOVUE-300) INJECTION 61%
INTRAVENOUS | Status: AC
  Filled 2017-08-19: qty 100

## 2017-08-19 NOTE — Discharge Instructions (Signed)
You were seen in the emergency department today for abdominal pain with nausea, vomiting, and diarrhea.  Your lab work and CT scan were reassuring here.  Your CT scan did not show any findings that would necessarily be causing your pain.  Your creatinine, a measure of kidney function, was slightly higher than previous values you have had, have this rechecked by your primary care doctor within 1 week.  Your potassium was slightly low at 3.1 (normal is 3.5-5.3) therefore we are giving a few days of an oral potassium supplement, take this daily.  We are additionally given you a prescription for Zofran, this is a nausea medication, take this every 8 hours as needed for nausea and vomiting.  We have prescribed you new medication(s) today. Discuss the medications prescribed today with your pharmacist as they can have adverse effects and interactions with your other medicines including over the counter and prescribed medications. Seek medical evaluation if you start to experience new or abnormal symptoms after taking one of these medicines, seek care immediately if you start to experience difficulty breathing, feeling of your throat closing, facial swelling, or rash as these could be indications of a more serious allergic reaction Continue to take the Carafate and omeprazole prescribed by Dr. Michail Sermon.  Follow-up with Dr. Michail Sermon on Monday for reevaluation.  Return to the ER at any time for any new or worsening symptoms including but not limited to inability to keep fluids down, worsening pain, fever, blood in your stool, blood in your vomit, or any other concerns.

## 2017-08-19 NOTE — ED Triage Notes (Signed)
Pt reports that he has been diagnosed with ulcers after an endoscopy yesterday. He is reporting increased abdominal pain vomiting and diarrhea. A&Ox4.

## 2017-08-19 NOTE — ED Provider Notes (Signed)
[facility] DEPT Provider Note   CSN: [id number] Arrival date & time: 08/19/17  1901     History   Chief Complaint Chief Complaint  Patient presents with  . Abdominal Pain  . Diarrhea    HPI Andre Maldonado is a 77 y.o. male with a hx of hypothyroidism, HTN, and CAD who presents to the ED at the request of his gastroenterologist Dr. Michail Sermon for abdominal pain that started today. Patient states he had an upper endoscopy yesterday by Dr. 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 He was sent home with prescriptions for omeprazole 40 mg BID and Carafate TID. States he has been taking these as prescribed. States yesterday evening he was able to eat a potato, was not in any pain. States today he had a pancake, shortly after he developed epigastric/periumbilical discomfort which has been persistent. Rates pain a 15/10 in severity, no alleviating/aggravating factors. Has had 2 episodes of vomiting, 4 episodes of diarrhea- each non bloody. He called Dr. Kathline Magic office who requested he come to the ED for CT scan to ensure no complications from the procedure. Patient denies fever, chills, dysuria, or testicular pain/swelling.   HPI  Past Medical History:  Diagnosis Date  . Arthritis    hands, elbows, knees  . Bladder cancer [facility])    urologist-  [name]  . Coronary arteriosclerosis in native artery    s/p  cabg x1  1981--  Cardiologist-- dr [name]  LOV 02-05-2009  . Hyperlipidemia   . Hypertension   . Hypothyroidism   . Nocturia   . Peripheral vascular disease ([place])    per duplex 03-05-2014  occluded left  SFA  distal level reconstitution at the popliteal  . 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 Wears dentures    upper  . Wears glasses     Patient Active Problem List   Diagnosis Date Noted  . HYPOTHYROIDISM 09/23/2006  . HYPERLIPIDEMIA 09/23/2006  .  HYPERTENSION 09/23/2006  . CORONARY ARTERY DISEASE 09/23/2006    Past Surgical History:  Procedure Laterality Date  . ANTERIOR CERVICAL DECOMP/DISCECTOMY FUSION  02/19/2009   C5 -- C6  . CORONARY ARTERY BYPASS GRAFT  1981   in [place] , [place]   x1  LIMA to LAD  . CYSTOSCOPY WITH BIOPSY N/A 06/14/2016   Procedure: CYSTOSCOPY WITH BIOPSY;  Surgeon: [name], MD;  Location: [facility];  Service: Urology;  Laterality: N/A;  . KNEE SURGERY Left 1976  . TRANSURETHRAL RESECTION OF BLADDER TUMOR N/A 04/12/2016   Procedure: TRANSURETHRAL RESECTION OF BLADDER TUMOR (TURBT) WITH EPIRUBICIN  INSTILLATION;  Surgeon: [name], MD;  Location: [facility];  Service: Urology;  Laterality: N/A;        Home Medications    Prior to Admission medications   Medication Sig Start Date End Date Taking? Authorizing Provider  levothyroxine (SYNTHROID, LEVOTHROID) 100 MCG tablet Take 100 mcg by mouth daily before breakfast.   Yes [provider]  losartan-hydrochlorothiazide (HYZAAR) 100-12.5 MG tablet Take 1 tablet by mouth every morning.   Yes [provider]  metoprolol (LOPRESSOR) 50 MG tablet Take 25 mg by mouth 2 (two) times daily.    Yes [provider]  Multiple Vitamins-Minerals (CENTRUM SILVER 50+MEN PO) Take 1 tablet by mouth daily.   Yes [provider]  omeprazole (PRILOSEC) 40 MG capsule Take 40 mg  by mouth 2 (two) times daily. 08/18/17  Yes [provider]  simvastatin (ZOCOR) 20 MG tablet Take 20 mg by mouth every morning.    Yes [provider]  sucralfate (CARAFATE) 1 g tablet Take 1 g by mouth 3 (three) times daily. 08/18/17  Yes [provider]  ciprofloxacin (CIPRO) 500 MG tablet Take 1 tablet (500 mg total) by mouth 2 (two) times daily. Patient not taking: Reported on 08/19/2017 07/27/17   Maczis, Barth Kirks, PA-C  metroNIDAZOLE (FLAGYL) 500 MG tablet Take 1 tablet (500 mg total) by  mouth 2 (two) times daily with a meal. DO NOT CONSUME ALCOHOL WHILE TAKING THIS MEDICATION. Patient not taking: Reported on 08/19/2017 07/27/17   Jillyn Ledger, PA-C  sulfamethoxazole-trimethoprim (BACTRIM DS,SEPTRA DS) 800-160 MG tablet Take 1 tablet by mouth 2 (two) times daily. Patient not taking: Reported on 08/19/2017 06/14/16   [name], MD  traMADol (ULTRAM) 50 MG tablet Take 1 tablet (50 mg total) by mouth every 6 (six) hours as needed. Patient not taking: Reported on 08/19/2017 07/27/17   Maczis, Barth Kirks, PA-C    Family History History reviewed. No pertinent family history.  Social History Social History   Tobacco Use  . Smoking status: Former Smoker    Years: 30.00    Types: Cigarettes    Last attempt to quit: 04/08/1985    Years since quitting: 32.3  . Smokeless tobacco: Never Used  Substance Use Topics  . Alcohol use: Yes    Comment: occasional  . Drug use: No     Allergies   Patient has no known allergies.   Review of Systems Review of Systems  Constitutional: Negative for chills and fever.  Respiratory: Negative for shortness of breath.   Cardiovascular: Negative for chest pain.  Gastrointestinal: Positive for abdominal pain, diarrhea, nausea and vomiting. Negative for blood in stool and constipation.  Genitourinary: Negative for penile swelling and scrotal swelling.  All other systems reviewed and are negative.    Physical Exam Updated Vital Signs BP (!) 163/71 (BP Location: Right Arm)   Pulse 62   Temp 97.6 F (36.4 C) (Oral)   Resp 20   SpO2 98%   Physical Exam  Constitutional: He appears well-developed and well-nourished. He appears distressed (mild secondary to pain).  HENT:  Head: Normocephalic and atraumatic.  Eyes: Conjunctivae are normal. Right eye exhibits no discharge. Left eye exhibits no discharge.  Cardiovascular: Normal rate and regular rhythm.  No murmur heard. Pulmonary/Chest: Breath sounds normal. No respiratory  distress. He has no wheezes. He has no rales.  Abdominal: Soft. He exhibits no distension. There is tenderness in the epigastric area and periumbilical area. There is no rigidity and no rebound.  Neurological: He is alert.  Clear speech.   Skin: Skin is warm and dry. No rash noted.  Psychiatric: He has a normal mood and affect. His behavior is normal.  Nursing note and vitals reviewed.  ED Treatments / Results  Labs Results for orders placed or performed during the hospital encounter of 08/19/17  Lipase, blood  Result Value Ref Range   Lipase 36 11 - 51 U/L  Comprehensive metabolic panel  Result Value Ref Range   Sodium 135 135 - 145 mmol/L   Potassium 3.1 (L) 3.5 - 5.1 mmol/L   Chloride 100 (L) 101 - 111 mmol/L   CO2 21 (L) 22 - 32 mmol/L   Glucose, Bld 133 (H) 65 - 99 mg/dL   BUN 20 6 - 20  mg/dL   Creatinine, Ser 1.43 (H) 0.61 - 1.24 mg/dL   Calcium 9.2 8.9 - 10.3 mg/dL   Total Protein 7.6 6.5 - 8.1 g/dL   Albumin 3.9 3.5 - 5.0 g/dL   AST 36 15 - 41 U/L   ALT 33 17 - 63 U/L   Alkaline Phosphatase 141 (H) 38 - 126 U/L   Total Bilirubin 0.8 0.3 - 1.2 mg/dL   GFR calc non Af Amer 45 (L) >60 mL/min   GFR calc Af Amer 53 (L) >60 mL/min   Anion gap 14 5 - 15  CBC  Result Value Ref Range   WBC 13.4 (H) 4.0 - 10.5 K/uL   RBC 4.52 4.22 - 5.81 MIL/uL   Hemoglobin 14.5 13.0 - 17.0 g/dL   HCT 40.8 39.0 - 52.0 %   MCV 90.3 78.0 - 100.0 fL   MCH 32.1 26.0 - 34.0 pg   MCHC 35.5 30.0 - 36.0 g/dL   RDW 12.6 11.5 - 15.5 %   Platelets 281 150 - 400 K/uL  Urinalysis, Routine w reflex microscopic  Result Value Ref Range   Color, Urine YELLOW YELLOW   APPearance CLEAR CLEAR   Specific Gravity, Urine 1.021 1.005 - 1.030   pH 5.0 5.0 - 8.0   Glucose, UA NEGATIVE NEGATIVE mg/dL   Hgb urine dipstick NEGATIVE NEGATIVE   Bilirubin Urine NEGATIVE NEGATIVE   Ketones, ur NEGATIVE NEGATIVE mg/dL   Protein, ur NEGATIVE NEGATIVE mg/dL   Nitrite NEGATIVE NEGATIVE   Leukocytes, UA NEGATIVE  NEGATIVE  EKG None  Radiology Ct Abdomen Pelvis W Contrast  Result Date: 08/19/2017 CLINICAL DATA:  Worsening abdominal pain, vomiting, and diarrhea. Peptic ulcer disease. Bladder carcinoma. EXAM: CT ABDOMEN AND PELVIS WITH CONTRAST TECHNIQUE: Multidetector CT imaging of the abdomen and pelvis was performed using the standard protocol following bolus administration of intravenous contrast. CONTRAST:  74mL ISOVUE-300 IOPAMIDOL (ISOVUE-300) INJECTION 61% COMPARISON:  07/27/2017 FINDINGS: Lower Chest: No acute findings. Chronic bibasilar scarring and mild bronchiectasis. Hepatobiliary: No hepatic masses identified. Gallbladder is unremarkable. Pancreas:  No mass or inflammatory changes. Spleen: Within normal limits in size and appearance. Adrenals/Urinary Tract: No masses identified. Stable benign-appearing right renal sinus cyst. No evidence of hydronephrosis. Unremarkable unopacified urinary bladder. Stomach/Bowel: Small hiatal hernia again seen. No evidence of obstruction, inflammatory process or abnormal fluid collections. Normal appendix visualized. Diverticulosis again seen involving the descending and sigmoid colon, however there is no evidence of diverticulitis on current exam. Vascular/Lymphatic: No pathologically enlarged lymph nodes. No abdominal aortic aneurysm. Aortic atherosclerosis. Reproductive:  Stable mildly enlarged prostate. Other:  None. Musculoskeletal:  No suspicious bone lesions identified. IMPRESSION: Colonic diverticulosis, without radiographic evidence of diverticulitis or other acute findings. Stable small hiatal hernia. Stable mildly enlarged prostate. Electronically Signed   By: Earle Gell M.D.   On: 08/19/2017 21:55    Procedures Procedures (including critical care time)  Medications Ordered in ED Medications - No data to display   Initial Impression / Assessment and Plan / ED Course  I have reviewed the triage vital signs and the nursing notes.  Pertinent labs &  imaging results that were available during my care of the patient were reviewed by me and considered in my medical decision making (see chart for details).   Patient presents with abdominal pain and N/V/D at request of gastroenterologist Dr. Michail Sermon for CT abdomen/pelvis given recent upper endoscopy. Patient is nontoxic appearing, noted to be in mild distress secondary to pain, vitals notable for HTN and intermittent  bradycardia- do not suspect HTN emergency at this time. On exam patient is tender in the epigastric/periumbilical regions, no peritoneal signs. 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 He was sent home with prescriptions for omeprazole 40 mg BID and Carafate TID. Will initiate work up with labs and CT abdomen pelvis with administration of fluids, morphine, and zofran.   20:38: RE-EVAL: Patient has had no change in his pain following morphine. Nausea has resolved. Will order Dilaudid.   Labs reviewed. Notable for hypokalemia and 3.1- will orally replace with temporary at home prescription, hyperglycemia at 133, hypochloremia at 100- patient has received 1L NS. Nonspecific leukocytosis at 13.4. No anemia. UA without signs of infection. Creatinine of 1.43 somewhat increased from prior 1.27 three weeks prior, this will require PCP recheck. CT abdomen pelvis without significant acute abnormalities.   23:00: RE-EVAL: Patient feeling much better following Dilaudid. Repeat abdominal exam remains without peritoneal signs. Will PO challenge.   23:40: RE-EVAL: Patient is tolerating PO. Feels comfortable with DC  Home.   Patient does not meet the SIRS or Sepsis criteria.  On repeat exam patient does not have a surgical abdomen and there are no peritoneal signs.  No indication of appendicitis, bowel obstruction, bowel perforation, cholecystitis, diverticulitis, or pancreatitis.  Will discharge home with continuation of  medications initiated by gastroenterology. Will provide a few tablets of zofran as well as oral potassium given potassium of 3.1 in the ED. I discussed results, treatment plan, need for GI follow-up, and return precautions with the patient. Provided opportunity for questions, patient confirmed understanding and is in agreement with plan.   Findings and plan of care discussed with supervising physician Dr. Ralene Bathe who personally evaluated and examined this patient and is in agreement with plan.    Final Clinical Impressions(s) / ED Diagnoses   Final diagnoses:  Nausea vomiting and diarrhea  Abdominal pain, unspecified abdominal location    ED Discharge Orders        Ordered    ondansetron (ZOFRAN ODT) 4 MG disintegrating tablet  Every 8 hours PRN     08/19/17 2341    potassium chloride SA (K-DUR,KLOR-CON) 20 MEQ tablet  Daily     08/19/17 2341       Haig Gerardo, Glynda Jaeger, PA-C 08/20/17 0014    Quintella Reichert, MD 08/21/17 [phone]

## 2017-09-13 ENCOUNTER — Encounter (HOSPITAL_COMMUNITY): Payer: Self-pay | Admitting: Emergency Medicine

## 2017-09-13 ENCOUNTER — Inpatient Hospital Stay (HOSPITAL_COMMUNITY)
Admission: EM | Admit: 2017-09-13 | Discharge: 2017-10-08 | DRG: 871 | Disposition: E | Payer: Medicare Other | Attending: Pulmonary Disease | Admitting: Pulmonary Disease

## 2017-09-13 DIAGNOSIS — K560 Paralytic ileus: Secondary | ICD-10-CM | POA: Diagnosis present

## 2017-09-13 DIAGNOSIS — R0603 Acute respiratory distress: Secondary | ICD-10-CM

## 2017-09-13 DIAGNOSIS — W19XXXA Unspecified fall, initial encounter: Secondary | ICD-10-CM

## 2017-09-13 DIAGNOSIS — Z01818 Encounter for other preprocedural examination: Secondary | ICD-10-CM

## 2017-09-13 DIAGNOSIS — R579 Shock, unspecified: Secondary | ICD-10-CM

## 2017-09-13 DIAGNOSIS — I739 Peripheral vascular disease, unspecified: Secondary | ICD-10-CM | POA: Diagnosis present

## 2017-09-13 DIAGNOSIS — Z972 Presence of dental prosthetic device (complete) (partial): Secondary | ICD-10-CM

## 2017-09-13 DIAGNOSIS — M19021 Primary osteoarthritis, right elbow: Secondary | ICD-10-CM | POA: Diagnosis present

## 2017-09-13 DIAGNOSIS — Z8719 Personal history of other diseases of the digestive system: Secondary | ICD-10-CM

## 2017-09-13 DIAGNOSIS — G9341 Metabolic encephalopathy: Secondary | ICD-10-CM | POA: Diagnosis present

## 2017-09-13 DIAGNOSIS — Z7189 Other specified counseling: Secondary | ICD-10-CM

## 2017-09-13 DIAGNOSIS — Z973 Presence of spectacles and contact lenses: Secondary | ICD-10-CM

## 2017-09-13 DIAGNOSIS — M170 Bilateral primary osteoarthritis of knee: Secondary | ICD-10-CM | POA: Diagnosis present

## 2017-09-13 DIAGNOSIS — J9602 Acute respiratory failure with hypercapnia: Secondary | ICD-10-CM | POA: Diagnosis present

## 2017-09-13 DIAGNOSIS — A419 Sepsis, unspecified organism: Principal | ICD-10-CM

## 2017-09-13 DIAGNOSIS — J9601 Acute respiratory failure with hypoxia: Secondary | ICD-10-CM | POA: Diagnosis present

## 2017-09-13 DIAGNOSIS — I639 Cerebral infarction, unspecified: Secondary | ICD-10-CM | POA: Diagnosis present

## 2017-09-13 DIAGNOSIS — Z8551 Personal history of malignant neoplasm of bladder: Secondary | ICD-10-CM

## 2017-09-13 DIAGNOSIS — K56600 Partial intestinal obstruction, unspecified as to cause: Secondary | ICD-10-CM | POA: Diagnosis present

## 2017-09-13 DIAGNOSIS — W06XXXA Fall from bed, initial encounter: Secondary | ICD-10-CM | POA: Diagnosis present

## 2017-09-13 DIAGNOSIS — K7200 Acute and subacute hepatic failure without coma: Secondary | ICD-10-CM | POA: Diagnosis present

## 2017-09-13 DIAGNOSIS — I959 Hypotension, unspecified: Secondary | ICD-10-CM | POA: Diagnosis present

## 2017-09-13 DIAGNOSIS — K259 Gastric ulcer, unspecified as acute or chronic, without hemorrhage or perforation: Secondary | ICD-10-CM | POA: Diagnosis present

## 2017-09-13 DIAGNOSIS — R6521 Severe sepsis with septic shock: Secondary | ICD-10-CM | POA: Diagnosis present

## 2017-09-13 DIAGNOSIS — E039 Hypothyroidism, unspecified: Secondary | ICD-10-CM | POA: Diagnosis present

## 2017-09-13 DIAGNOSIS — E876 Hypokalemia: Secondary | ICD-10-CM | POA: Diagnosis present

## 2017-09-13 DIAGNOSIS — I10 Essential (primary) hypertension: Secondary | ICD-10-CM | POA: Diagnosis present

## 2017-09-13 DIAGNOSIS — Z66 Do not resuscitate: Secondary | ICD-10-CM | POA: Diagnosis present

## 2017-09-13 DIAGNOSIS — R1113 Vomiting of fecal matter: Secondary | ICD-10-CM

## 2017-09-13 DIAGNOSIS — Z515 Encounter for palliative care: Secondary | ICD-10-CM

## 2017-09-13 DIAGNOSIS — R197 Diarrhea, unspecified: Secondary | ICD-10-CM | POA: Diagnosis not present

## 2017-09-13 DIAGNOSIS — K659 Peritonitis, unspecified: Secondary | ICD-10-CM | POA: Diagnosis present

## 2017-09-13 DIAGNOSIS — Z87891 Personal history of nicotine dependence: Secondary | ICD-10-CM

## 2017-09-13 DIAGNOSIS — R109 Unspecified abdominal pain: Secondary | ICD-10-CM | POA: Diagnosis present

## 2017-09-13 DIAGNOSIS — M19041 Primary osteoarthritis, right hand: Secondary | ICD-10-CM | POA: Diagnosis present

## 2017-09-13 DIAGNOSIS — R112 Nausea with vomiting, unspecified: Secondary | ICD-10-CM | POA: Diagnosis not present

## 2017-09-13 DIAGNOSIS — M19022 Primary osteoarthritis, left elbow: Secondary | ICD-10-CM | POA: Diagnosis present

## 2017-09-13 DIAGNOSIS — Z951 Presence of aortocoronary bypass graft: Secondary | ICD-10-CM

## 2017-09-13 DIAGNOSIS — Z9079 Acquired absence of other genital organ(s): Secondary | ICD-10-CM

## 2017-09-13 DIAGNOSIS — N179 Acute kidney failure, unspecified: Secondary | ICD-10-CM | POA: Diagnosis present

## 2017-09-13 DIAGNOSIS — K2270 Barrett's esophagus without dysplasia: Secondary | ICD-10-CM | POA: Diagnosis present

## 2017-09-13 DIAGNOSIS — R40243 Glasgow coma scale score 3-8, unspecified time: Secondary | ICD-10-CM | POA: Diagnosis not present

## 2017-09-13 DIAGNOSIS — Y848 Other medical procedures as the cause of abnormal reaction of the patient, or of later complication, without mention of misadventure at the time of the procedure: Secondary | ICD-10-CM | POA: Diagnosis present

## 2017-09-13 DIAGNOSIS — K5730 Diverticulosis of large intestine without perforation or abscess without bleeding: Secondary | ICD-10-CM | POA: Diagnosis present

## 2017-09-13 DIAGNOSIS — K2980 Duodenitis without bleeding: Secondary | ICD-10-CM | POA: Diagnosis present

## 2017-09-13 DIAGNOSIS — Z4659 Encounter for fitting and adjustment of other gastrointestinal appliance and device: Secondary | ICD-10-CM

## 2017-09-13 DIAGNOSIS — I2510 Atherosclerotic heart disease of native coronary artery without angina pectoris: Secondary | ICD-10-CM

## 2017-09-13 DIAGNOSIS — E785 Hyperlipidemia, unspecified: Secondary | ICD-10-CM

## 2017-09-13 DIAGNOSIS — Z79890 Hormone replacement therapy (postmenopausal): Secondary | ICD-10-CM

## 2017-09-13 DIAGNOSIS — R1013 Epigastric pain: Secondary | ICD-10-CM | POA: Diagnosis not present

## 2017-09-13 DIAGNOSIS — E874 Mixed disorder of acid-base balance: Secondary | ICD-10-CM | POA: Diagnosis present

## 2017-09-13 DIAGNOSIS — Z9289 Personal history of other medical treatment: Secondary | ICD-10-CM

## 2017-09-13 DIAGNOSIS — M19042 Primary osteoarthritis, left hand: Secondary | ICD-10-CM | POA: Diagnosis present

## 2017-09-13 LAB — URINALYSIS, ROUTINE W REFLEX MICROSCOPIC
Bilirubin Urine: NEGATIVE
Glucose, UA: NEGATIVE mg/dL
Hgb urine dipstick: NEGATIVE
Ketones, ur: NEGATIVE mg/dL
Leukocytes, UA: NEGATIVE
Nitrite: NEGATIVE
Protein, ur: 30 mg/dL — AB
Specific Gravity, Urine: 1.027 (ref 1.005–1.030)
pH: 5 (ref 5.0–8.0)

## 2017-09-13 LAB — CBC WITH DIFFERENTIAL/PLATELET
Basophils Absolute: 0 K/uL (ref 0.0–0.1)
Basophils Relative: 0 %
Eosinophils Absolute: 0 K/uL (ref 0.0–0.7)
Eosinophils Relative: 0 %
HCT: 39.5 % (ref 39.0–52.0)
Hemoglobin: 14.6 g/dL (ref 13.0–17.0)
Lymphocytes Relative: 8 %
Lymphs Abs: 1.6 K/uL (ref 0.7–4.0)
MCH: 32.5 pg (ref 26.0–34.0)
MCHC: 37 g/dL — ABNORMAL HIGH (ref 30.0–36.0)
MCV: 88 fL (ref 78.0–100.0)
Monocytes Absolute: 0.8 K/uL (ref 0.1–1.0)
Monocytes Relative: 4 %
Neutro Abs: 18.1 K/uL — ABNORMAL HIGH (ref 1.7–7.7)
Neutrophils Relative %: 88 %
Platelets: 370 K/uL (ref 150–400)
RBC: 4.49 MIL/uL (ref 4.22–5.81)
RDW: 13.3 % (ref 11.5–15.5)
WBC: 20.5 K/uL — ABNORMAL HIGH (ref 4.0–10.5)

## 2017-09-13 LAB — COMPREHENSIVE METABOLIC PANEL WITH GFR
ALT: 22 U/L (ref 17–63)
AST: 36 U/L (ref 15–41)
Albumin: 2.8 g/dL — ABNORMAL LOW (ref 3.5–5.0)
Alkaline Phosphatase: 257 U/L — ABNORMAL HIGH (ref 38–126)
Anion gap: 16 — ABNORMAL HIGH (ref 5–15)
BUN: 17 mg/dL (ref 6–20)
CO2: 27 mmol/L (ref 22–32)
Calcium: 8.9 mg/dL (ref 8.9–10.3)
Chloride: 92 mmol/L — ABNORMAL LOW (ref 101–111)
Creatinine, Ser: 1.36 mg/dL — ABNORMAL HIGH (ref 0.61–1.24)
GFR calc Af Amer: 56 mL/min — ABNORMAL LOW
GFR calc non Af Amer: 48 mL/min — ABNORMAL LOW
Glucose, Bld: 176 mg/dL — ABNORMAL HIGH (ref 65–99)
Potassium: 2.2 mmol/L — CL (ref 3.5–5.1)
Sodium: 135 mmol/L (ref 135–145)
Total Bilirubin: 0.8 mg/dL (ref 0.3–1.2)
Total Protein: 6.6 g/dL (ref 6.5–8.1)

## 2017-09-13 LAB — BASIC METABOLIC PANEL
ANION GAP: 19 — AB (ref 5–15)
BUN: 17 mg/dL (ref 6–20)
CALCIUM: 9.2 mg/dL (ref 8.9–10.3)
CO2: 25 mmol/L (ref 22–32)
Chloride: 93 mmol/L — ABNORMAL LOW (ref 101–111)
Creatinine, Ser: 1.38 mg/dL — ABNORMAL HIGH (ref 0.61–1.24)
GFR, EST AFRICAN AMERICAN: 55 mL/min — AB (ref >60)
GFR, EST NON AFRICAN AMERICAN: 47 mL/min — AB (ref >60)
Glucose, Bld: 185 mg/dL — ABNORMAL HIGH (ref 65–99)
Potassium: 2.8 mmol/L — ABNORMAL LOW (ref 3.5–5.1)
Sodium: 137 mmol/L (ref 135–145)

## 2017-09-13 LAB — MAGNESIUM: Magnesium: 2 mg/dL (ref 1.7–2.4)

## 2017-09-13 LAB — LIPASE, BLOOD: Lipase: 25 U/L (ref 11–51)

## 2017-09-13 MED ORDER — METOPROLOL TARTRATE 25 MG PO TABS
25.0000 mg | ORAL_TABLET | Freq: Two times a day (BID) | ORAL | Status: DC
  Administered 2017-09-13: 25 mg via ORAL
  Filled 2017-09-13: qty 1

## 2017-09-13 MED ORDER — HYDROCODONE-ACETAMINOPHEN 5-325 MG PO TABS
1.0000 | ORAL_TABLET | ORAL | Status: DC | PRN
  Administered 2017-09-14: 2 via ORAL
  Filled 2017-09-13: qty 2

## 2017-09-13 MED ORDER — MORPHINE SULFATE (PF) 4 MG/ML IV SOLN
1.0000 mg | INTRAVENOUS | Status: DC | PRN
  Administered 2017-09-13 – 2017-09-14 (×3): 1 mg via INTRAVENOUS
  Filled 2017-09-13 (×3): qty 1

## 2017-09-13 MED ORDER — ONDANSETRON HCL 4 MG PO TABS: 4.0000 mg | ORAL_TABLET | Freq: Four times a day (QID) | ORAL | Status: DC | PRN

## 2017-09-13 MED ORDER — ONDANSETRON HCL 4 MG/2ML IJ SOLN
4.0000 mg | Freq: Four times a day (QID) | INTRAMUSCULAR | Status: DC | PRN
  Administered 2017-09-14: 4 mg via INTRAVENOUS
  Filled 2017-09-13: qty 2

## 2017-09-13 MED ORDER — SIMVASTATIN 20 MG PO TABS
20.0000 mg | ORAL_TABLET | Freq: Every morning | ORAL | Status: DC
  Administered 2017-09-14: 20 mg via ORAL
  Filled 2017-09-13: qty 1

## 2017-09-13 MED ORDER — POTASSIUM CHLORIDE CRYS ER 20 MEQ PO TBCR
40.0000 meq | EXTENDED_RELEASE_TABLET | Freq: Once | ORAL | Status: AC
  Administered 2017-09-13: 40 meq via ORAL
  Filled 2017-09-13: qty 2

## 2017-09-13 MED ORDER — HEPARIN SODIUM (PORCINE) 5000 UNIT/ML IJ SOLN
5000.0000 [IU] | Freq: Three times a day (TID) | INTRAMUSCULAR | Status: DC
  Administered 2017-09-13 – 2017-09-14 (×5): 5000 [IU] via SUBCUTANEOUS
  Filled 2017-09-13 (×8): qty 1

## 2017-09-13 MED ORDER — LEVOTHYROXINE SODIUM 100 MCG PO TABS
100.0000 ug | ORAL_TABLET | Freq: Every day | ORAL | Status: DC
  Administered 2017-09-14: 100 ug via ORAL
  Filled 2017-09-13 (×2): qty 1

## 2017-09-13 MED ORDER — PIPERACILLIN-TAZOBACTAM 3.375 G IVPB 30 MIN
3.3750 g | Freq: Once | INTRAVENOUS | Status: AC
  Administered 2017-09-13: 3.375 g via INTRAVENOUS
  Filled 2017-09-13: qty 50

## 2017-09-13 MED ORDER — ACETAMINOPHEN 325 MG PO TABS: 650.0000 mg | ORAL_TABLET | Freq: Four times a day (QID) | ORAL | Status: DC | PRN

## 2017-09-13 MED ORDER — PIPERACILLIN-TAZOBACTAM 3.375 G IVPB
3.3750 g | Freq: Three times a day (TID) | INTRAVENOUS | Status: DC
  Administered 2017-09-14 – 2017-09-15 (×5): 3.375 g via INTRAVENOUS
  Filled 2017-09-13 (×7): qty 50

## 2017-09-13 MED ORDER — MORPHINE SULFATE (PF) 4 MG/ML IV SOLN
4.0000 mg | Freq: Once | INTRAVENOUS | Status: AC
  Administered 2017-09-13: 4 mg via INTRAVENOUS
  Filled 2017-09-13: qty 1

## 2017-09-13 MED ORDER — SENNOSIDES-DOCUSATE SODIUM 8.6-50 MG PO TABS: 1.0000 | ORAL_TABLET | Freq: Every evening | ORAL | Status: DC | PRN

## 2017-09-13 MED ORDER — ACETAMINOPHEN 650 MG RE SUPP: 650.0000 mg | Freq: Four times a day (QID) | RECTAL | Status: DC | PRN

## 2017-09-13 MED ORDER — LOSARTAN POTASSIUM 50 MG PO TABS: 100.0000 mg | ORAL_TABLET | Freq: Every day | ORAL | Status: DC

## 2017-09-13 MED ORDER — SODIUM CHLORIDE 0.9 % IV BOLUS
500.0000 mL | Freq: Once | INTRAVENOUS | Status: AC
  Administered 2017-09-13: 500 mL via INTRAVENOUS

## 2017-09-13 MED ORDER — HYDROCHLOROTHIAZIDE 12.5 MG PO CAPS: 12.5000 mg | ORAL_CAPSULE | Freq: Every day | ORAL | Status: DC

## 2017-09-13 MED ORDER — BISACODYL 10 MG RE SUPP: 10.0000 mg | Freq: Every day | RECTAL | Status: DC | PRN

## 2017-09-13 MED ORDER — LOSARTAN POTASSIUM-HCTZ 100-12.5 MG PO TABS: 1.0000 | ORAL_TABLET | Freq: Every morning | ORAL | Status: DC

## 2017-09-13 MED ORDER — ONDANSETRON HCL 4 MG/2ML IJ SOLN
4.0000 mg | Freq: Once | INTRAMUSCULAR | Status: AC
  Administered 2017-09-13: 4 mg via INTRAVENOUS
  Filled 2017-09-13: qty 2

## 2017-09-13 MED ORDER — POTASSIUM CHLORIDE 10 MEQ/100ML IV SOLN
10.0000 meq | INTRAVENOUS | Status: AC
  Administered 2017-09-13: 10 meq via INTRAVENOUS
  Filled 2017-09-13 (×2): qty 100

## 2017-09-13 MED ORDER — HYDROMORPHONE HCL 2 MG/ML IJ SOLN
1.0000 mg | Freq: Once | INTRAMUSCULAR | Status: AC
  Administered 2017-09-13: 1 mg via INTRAVENOUS
  Filled 2017-09-13: qty 1

## 2017-09-13 MED ORDER — GI COCKTAIL ~~LOC~~
30.0000 mL | Freq: Once | ORAL | Status: AC
  Administered 2017-09-13: 30 mL via ORAL
  Filled 2017-09-13: qty 30

## 2017-09-13 MED ORDER — MAGNESIUM SULFATE 2 GM/50ML IV SOLN
2.0000 g | Freq: Once | INTRAVENOUS | Status: AC
  Administered 2017-09-13: 2 g via INTRAVENOUS
  Filled 2017-09-13: qty 50

## 2017-09-13 NOTE — ED Provider Notes (Signed)
[facility] EMERGENCY DEPARTMENT Provider Note   CSN: [id number] Arrival date & time: 09/14/2017  1124     History   Chief Complaint Chief Complaint  Patient presents with  . Abdominal Pain    Nausae, vomiting, diarrhea    HPI Andre Maldonado is a 77 y.o. male with a past medical history of bladder cancer, CAD s/p CABG 1981, HTN, PVD, peptic ulcer disease who presents to the ED today complaining of epigastric and periumbilical abdominal pain. He reports associated intermittent nausea and vomiting and diarrhea. He reports the symptoms have been going on for 2 months now. He had an EGD by Dr. Michail Sermon on 08/18/17 which showed Barrett's esophagus, and clean-based gastric ulcers and duodenitis. He was prescribed omeprazole and Carafate. He feels the omeprazole has not helped him. He stopped taking the Carafate as his primary care doctor stated that the magnesium in gradient may be causing his diarrhea. He states the pain in his epigastrium as a burning sensation and worsens when he eats. He denies any fevers, chills. He states he tried to contact his GI office multiple times and they have not answered nor returned his calls.  Of note he was seen in the ED on 08/19/17, the day after his EGD for similar symptoms. He had a CT scan of his abdomen and pelvis at that time which was unremarkable for acute intra-abdominal pathology.  HPI  Past Medical History:  Diagnosis Date  . Arthritis    hands, elbows, knees  . Bladder cancer [facility])    urologist-  [name]  . Coronary arteriosclerosis in native artery    s/p  cabg x1  1981--  Cardiologist-- dr [name]  LOV 02-05-2009  . Hyperlipidemia   . Hypertension   . Hypothyroidism   . Nocturia   . Peripheral vascular disease ([place])    per duplex 03-05-2014  occluded left  SFA  distal level reconstitution at the popliteal  . 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 Wears dentures    upper  . Wears glasses     Patient Active  Problem List   Diagnosis Date Noted  . HYPOTHYROIDISM 09/23/2006  . HYPERLIPIDEMIA 09/23/2006  . HYPERTENSION 09/23/2006  . CORONARY ARTERY DISEASE 09/23/2006    Past Surgical History:  Procedure Laterality Date  . ANTERIOR CERVICAL DECOMP/DISCECTOMY FUSION  02/19/2009   C5 -- C6  . CORONARY ARTERY BYPASS GRAFT  1981   in [place] , [place]   x1  LIMA to LAD  . CYSTOSCOPY WITH BIOPSY N/A 06/14/2016   Procedure: CYSTOSCOPY WITH BIOPSY;  Surgeon: [name], MD;  [facility];  Service: Urology;  Laterality: N/A;  . KNEE SURGERY Left 1976  . TRANSURETHRAL RESECTION OF BLADDER TUMOR N/A 04/12/2016   Procedure: TRANSURETHRAL RESECTION OF BLADDER TUMOR (TURBT) WITH EPIRUBICIN  INSTILLATION;  Surgeon: [name], MD;  Location: [facility];  Service: Urology;  Laterality: N/A;        Home Medications    Prior to Admission medications   Medication Sig Start Date End Date Taking? Authorizing Provider  levothyroxine (SYNTHROID, LEVOTHROID) 100 MCG tablet Take 100 mcg by mouth daily before breakfast.    [provider]  losartan-hydrochlorothiazide (HYZAAR) 100-12.5 MG tablet Take 1 tablet by mouth every morning.    [provider]  metoprolol (LOPRESSOR) 50 MG tablet Take 25 mg by mouth 2 (two) times daily.     [provider]  Multiple Vitamins-Minerals (CENTRUM SILVER 50+MEN PO) Take 1 tablet by mouth daily.    [provider]  omeprazole (PRILOSEC) 40 MG capsule Take 40 mg by mouth 2 (two) times daily. 08/18/17   [provider]  ondansetron (ZOFRAN ODT) 4 MG disintegrating tablet Take 1 tablet (4 mg total) by mouth every 8 (eight) hours as needed for nausea or vomiting. 08/19/17   Petrucelli, Ludella Pranger R, PA-C  potassium chloride SA (K-DUR,KLOR-CON) 20 MEQ tablet Take 1 tablet (20 mEq total) by mouth daily. 08/19/17   Petrucelli, Moxie Kalil R, PA-C  simvastatin (ZOCOR) 20 MG tablet Take 20 mg by mouth  every morning.     [provider]  sucralfate (CARAFATE) 1 g tablet Take 1 g by mouth 3 (three) times daily. 08/18/17   [provider]    Family History No family history on file.  Social History Social History   Tobacco Use  . Smoking status: Former Smoker    Years: 30.00    Types: Cigarettes    Last attempt to quit: 04/08/1985    Years since quitting: 32.4  . Smokeless tobacco: Never Used  Substance Use Topics  . Alcohol use: Yes    Comment: occasional  . Drug use: No     Allergies   Patient has no known allergies.   Review of Systems Review of Systems   Physical Exam Updated Vital Signs BP (!) 172/59   Pulse 67   Temp 97.7 F (36.5 C) (Oral)   Resp 15   SpO2 99%   Physical Exam   ED Treatments / Results  Labs (all labs ordered are listed, but only abnormal results are displayed) Labs Reviewed  CBC WITH DIFFERENTIAL/PLATELET - Abnormal; Notable for the following components:      Result Value   WBC 20.5 (*)    MCHC 37.0 (*)    Neutro Abs 18.1 (*)    All other components within normal limits  LIPASE, BLOOD  COMPREHENSIVE METABOLIC PANEL    EKG None  Radiology No results found.  Procedures Procedures (including critical care time)  Medications Ordered in ED Medications  sodium chloride 0.9 % bolus 500 mL (500 mLs Intravenous New Bag/Given 09/08/2017 1307)  ondansetron (ZOFRAN) injection 4 mg (4 mg Intravenous Given 10/07/2017 1307)  morphine 4 MG/ML injection 4 mg (4 mg Intravenous Given 09/26/2017 1307)  gi cocktail (Maalox,Lidocaine,Donnatal) (30 mLs Oral Given 09/08/2017 1307)     Initial Impression / Assessment and Plan / ED Course  I have reviewed the triage vital signs and the nursing notes.  Pertinent labs & imaging results that were available during my care of the patient were reviewed by me and considered in my medical decision making (see chart for details).     77 y.o M presents to the ED today complaining of epigastric  pain x2 months, 40lb weight loss, N/V/D. He had EGD 08/18/17 with gastric ulcers, Barretts esophagus and duodenitis. He was started on omeprazole and carafate. He has been taking his PPI without relief. Still continues to have severe epigastric pain and is not tolerating much PO. Clinically he is non-toxic and non-septic appearing. Vitals are stable. He is tender over his epigatrium on exam. However he came to the ED 08/19/17 with similar symptoms, day after his EGD and had CT A/P that was unremarkable so I am not inclinced to repeat this study. Will give gi cocktail, IV morphine and fluids. Check CMP, CBC, lipase and re-assess.  WBC 20. K 2.2, will replete. Given  his diarrhea will also check magnesium level and give 2g IV magnesium. Pt telsl me that he was treated late March for diverticulitis with antibiotics. Given leukocytosis and diarrhea will check stool studies. He did not get much pain relief with medications. Will give 1g dilaudid. I will touch base with GI as well for further recs.   I spoke with Dr. Rosalie Gums with GI who recommends admission given his electrolyte abnormalities. They will consider colonoscopy as well given his diarrhea and weight loss. I will consult hospitalist for admission.  Spoke with [facility] hospitalists who will admit pt to their service.   Final Clinical Impressions(s) / ED Diagnoses   Final diagnoses:  None    ED Discharge Orders    None       Sakiyah Shur, Dondra Spry, PA-C 10/05/2017 [place], MD 09/14/17 [phone]

## 2017-09-13 NOTE — ED Notes (Signed)
Pt's wife let this tech know that the Pt vomited at 15:15. Pt's wife kept the bile for this tech to see. Pt did not throw up a great amount, but Pt and his wife were concerned he threw his medication up. RN notified.

## 2017-09-13 NOTE — ED Triage Notes (Signed)
Patient arrived via EMS from home. He reports vomiting, and diarrhea X "weeks" Denies chest pain. Received 176mcg Fentanyl from EMS. BP:174/76 HR:70 RR:18 99% 2lit [place]

## 2017-09-13 NOTE — ED Notes (Signed)
Urine culture also collected and sent down with UA to Main Lab. Order Requisition sent with instructions to hold culture.

## 2017-09-13 NOTE — H&P (Signed)
History and Physical    Andre Maldonado IRW:[id number] DOB: [birth year] DOA: 09/20/2017   PCP: Lavone Orn, MD   Patient coming from:  Home    Chief Complaint: Abdominal pain, nausea, vomiting and diarrhea  HPI: Andre Maldonado is a 77 y.o. male with medical history significant for bladder cancer, CAD status post CABG in 1981, hypertension, peripheral vascular disease, history of thick ulcer disease, presented to the emergency department today complaining of epigastric and periumbilical abdominal pain.  He also reports nausea, vomiting and nonbloody diarrhea.  In review, the patient had similar symptoms over the last 2 months.  He had an EGD by Dr. Ala Dach on 08/18/2017, showing Barrett's esophagus, clean base gastric ulcers, and duodenitis.  Patient was prescribed omeprazole and Carafate at the time.  He feels that the omeprazole has not helped him.  He stopped taking the Carafate, as his PCP told him that the magnesium in the medicine may be causing his diarrhea.  He states that the pain in his epigastrium is burning, worsening when he eats.  He denies any fever or chills.  He denies any night sweats.  He reports trying to contact his GI office multiple times, but no answer was obtained to his calls.  Of note, the patient was seen in the ED on 08/19/2017, the day after his EGD with similar symptoms, and at the time he had a CT scan of his abdomen and pelvis, which was unremarkable for acute intra-abdominal pathology.  He denies any chest pain, or palpitations.  He denies any shortness of breath.  He denies any headache, or vision changes.  He denies any vertigo, dizziness or syncope or presyncope.  He denies any lower extremity swelling or calf pain.  No dysuria or gross hematuria.  He denies any alcohol intake.  He denies any tobacco or recreational drug use.  He has a 40 pound weight loss over the last 2 months   ED Course:  BP (!) 185/84   Pulse 65   Temp 97.7 F (36.5 C) (Oral)   Resp 16    SpO2 99%   WBC 20.5, Lipase 25 Potassium was 2.2, which was repleted at the ED. He is also receiving 2 g of IV magnesium by EDP. Stool culture is pending. The patient received Zofran IV, morphine, and as well as GI cocktail, IV fluid bolus of 500 cc, normal saline, with some improvement of his symptoms.  However, the symptoms are not resolved. GI consult, Dr. Alessandra Bevels, who is to proceed with colonoscopy in a.m., giving his diarrhea, and 40 pound weight loss over the last 2 months.   Review of Systems:  As per HPI otherwise all other systems reviewed and are negative  Past Medical History:  Diagnosis Date  . Arthritis    hands, elbows, knees  . Bladder cancer [facility])    urologist-  [name]  . Coronary arteriosclerosis in native artery    s/p  cabg x1  1981--  Cardiologist-- dr [name]  LOV 02-05-2009  . Hyperlipidemia   . Hypertension   . Hypothyroidism   . Nocturia   . Peripheral vascular disease ([place])    per duplex 03-05-2014  occluded left  SFA  distal level reconstitution at the popliteal  . 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 Wears dentures    upper  . Wears glasses     Past Surgical History:  Procedure Laterality Date  . ANTERIOR CERVICAL DECOMP/DISCECTOMY FUSION  02/19/2009   C5 -- C6  . CORONARY ARTERY BYPASS GRAFT  1981   in [place] , [place]   x1  LIMA to LAD  . CYSTOSCOPY WITH BIOPSY N/A 06/14/2016   Procedure: CYSTOSCOPY WITH BIOPSY;  Surgeon: [name], MD;  Location: [facility];  Service: Urology;  Laterality: N/A;  . KNEE SURGERY Left 1976  . TRANSURETHRAL RESECTION OF BLADDER TUMOR N/A 04/12/2016   Procedure: TRANSURETHRAL RESECTION OF BLADDER TUMOR (TURBT) WITH EPIRUBICIN  INSTILLATION;  Surgeon: [name], MD;  Location: [facility];  Service: Urology;  Laterality: N/A;    Social History Social History   Socioeconomic History  . Marital status: Married    Spouse name: Not on file  . Number of  children: Not on file  . Years of education: Not on file  . Highest education level: Not on file  Occupational History  . Not on file  Social Needs  . Financial resource strain: Not on file  . Food insecurity:    Worry: Not on file    Inability: Not on file  . Transportation needs:    Medical: Not on file    Non-medical: Not on file  Tobacco Use  . Smoking status: Former Smoker    Years: 30.00    Types: Cigarettes    Last attempt to quit: 04/08/1985    Years since quitting: 32.4  . Smokeless tobacco: Never Used  Substance and Sexual Activity  . Alcohol use: Yes    Comment: occasional  . Drug use: No  . Sexual activity: Not on file  Lifestyle  . Physical activity:    Days per week: Not on file    Minutes per session: Not on file  . Stress: Not on file  Relationships  . Social connections:    Talks on phone: Not on file    Gets together: Not on file    Attends religious service: Not on file    Active member of club or organization: Not on file    Attends meetings of clubs or organizations: Not on file    Relationship status: Not on file  . Intimate partner violence:    Fear of current or ex partner: Not on file    Emotionally abused: Not on file    Physically abused: Not on file    Forced sexual activity: Not on file  Other Topics Concern  . Not on file  Social History Narrative  . Not on file     No Known Allergies  No family history on file.    Prior to Admission medications   Medication Sig Start Date End Date Taking? Authorizing Provider  levothyroxine (SYNTHROID, LEVOTHROID) 100 MCG tablet Take 100 mcg by mouth daily before breakfast.    [provider]  losartan-hydrochlorothiazide (HYZAAR) 100-12.5 MG tablet Take 1 tablet by mouth every morning.    [provider]  metoprolol (LOPRESSOR) 50 MG tablet Take 25 mg by mouth 2 (two) times daily.     [provider]  Multiple Vitamins-Minerals (CENTRUM SILVER 50+MEN PO) Take 1  tablet by mouth daily.    [provider]  omeprazole (PRILOSEC) 40 MG capsule Take 40 mg by mouth 2 (two) times daily. 08/18/17   [provider]  ondansetron (ZOFRAN ODT) 4 MG disintegrating tablet Take 1 tablet (4 mg total) by mouth every 8 (eight) hours as needed for nausea or vomiting. 08/19/17   Petrucelli, Samantha R, PA-C  potassium chloride SA (K-DUR,KLOR-CON) 20  MEQ tablet Take 1 tablet (20 mEq total) by mouth daily. 08/19/17   Petrucelli, Samantha R, PA-C  simvastatin (ZOCOR) 20 MG tablet Take 20 mg by mouth every morning.     [provider]  sucralfate (CARAFATE) 1 g tablet Take 1 g by mouth 3 (three) times daily. 08/18/17   [provider]    Physical Exam:  Vitals:   09/11/2017 1145 09/11/2017 1245 09/12/2017 1300 09/14/2017 1330  BP: (!) 175/75 137/89 (!) 172/59 (!) 185/84  Pulse: 70 73 67 65  Resp: 14 (!) 28 15 16   Temp:      TempSrc:      SpO2: 97% 99% 99% 99%   Constitutional: Ill-appearing, very anxious  eyes: PERRL, lids and conjunctivae normal ENMT: Mucous membranes are dry, without exudate or lesions  Neck: normal, supple, no masses, no thyromegaly Respiratory: clear to auscultation bilaterally, no wheezing, no crackles. Normal respiratory effort  Cardiovascular: Regular rate and rhythm, occasional ectopic beat,  murmur, rubs or gallops. No extremity edema. 2+ pedal pulses. No carotid bruits.  Abdomen: Soft tender around the epigastric area no hepatosplenomegaly. Bowel sounds positive.  Musculoskeletal: no clubbing / cyanosis. Moves all extremities Skin: no jaundice, No lesions.  Neurologic: Sensation intact  Strength equal in all extremities Psychiatric:   Alert and oriented x 3 anxious mood    Labs on Admission: I have personally reviewed following labs and imaging studies  CBC: Recent Labs  Lab 10/01/2017 1308  WBC 20.5*  NEUTROABS 18.1*  HGB 14.6  HCT 39.5  MCV 88.0  PLT 810    Basic Metabolic Panel: Recent Labs  Lab  09/18/2017 1308  NA 135  K 2.2*  CL 92*  CO2 27  GLUCOSE 176*  BUN 17  CREATININE 1.36*  CALCIUM 8.9    GFR: CrCl cannot be calculated (Unknown ideal weight.).  Liver Function Tests: Recent Labs  Lab 10/01/2017 1308  AST 36  ALT 22  ALKPHOS 257*  BILITOT 0.8  PROT 6.6  ALBUMIN 2.8*   Recent Labs  Lab 09/21/2017 1308  LIPASE 25   No results for input(s): AMMONIA in the last 168 hours.  Coagulation Profile: No results for input(s): INR, PROTIME in the last 168 hours.  Cardiac Enzymes: No results for input(s): CKTOTAL, CKMB, CKMBINDEX, TROPONINI in the last 168 hours.  BNP (last 3 results) No results for input(s): PROBNP in the last 8760 hours.  HbA1C: No results for input(s): HGBA1C in the last 72 hours.  CBG: No results for input(s): GLUCAP in the last 168 hours.  Lipid Profile: No results for input(s): CHOL, HDL, LDLCALC, TRIG, CHOLHDL, LDLDIRECT in the last 72 hours.  Thyroid Function Tests: No results for input(s): TSH, T4TOTAL, FREET4, T3FREE, THYROIDAB in the last 72 hours.  Anemia Panel: No results for input(s): VITAMINB12, FOLATE, FERRITIN, TIBC, IRON, RETICCTPCT in the last 72 hours.  Urine analysis:    Component Value Date/Time   COLORURINE YELLOW 08/19/2017 1942   APPEARANCEUR CLEAR 08/19/2017 1942   LABSPEC 1.021 08/19/2017 1942   PHURINE 5.0 08/19/2017 1942   GLUCOSEU NEGATIVE 08/19/2017 1942   HGBUR NEGATIVE 08/19/2017 1942   BILIRUBINUR NEGATIVE 08/19/2017 1942   KETONESUR NEGATIVE 08/19/2017 1942   PROTEINUR NEGATIVE 08/19/2017 1942   NITRITE NEGATIVE 08/19/2017 1942   LEUKOCYTESUR NEGATIVE 08/19/2017 1942    Sepsis Labs: @LABRCNTIP (procalcitonin:4,lacticidven:4) )No results found for this or any previous visit (from the past 240 hour(s)).   Radiological Exams on Admission: No results found.  EKG: Independently reviewed.  Assessment/Plan Principal Problem:   Epigastric pain Active Problems:   Nausea vomiting and diarrhea    Hypothyroidism   Hyperlipidemia   Essential hypertension   CAD (coronary artery disease)   History of bladder cancer   History of Barrett's esophagus  Intractable abdominal pain, nausea, vomiting and diarrhea in a patient with a recent history of colitis, gastric ulcer, Barrett's esophagus.  After receiving IV Zofran, pain medication, IV fluids, symptoms are slowly improving, but not resolved.  The patient is afebrile.  His white count is 20.5, and the setting of inflammation, and pain.  Last loose stool studies afternoon, last episode of vomiting this morning.  The patient continues to have dry heaves.  Stool cultures pending Inpatient MedSurg IVF with K replenishment Follow stool culture result  Clear liquid diet and advance as tolerated GI to consult, appreciate involvement, the patient is to undergo colonoscopy in the morning given his diarrhea, and a 40 pound weight loss over the last 2 months due to inability to keep food Continue IV antiemetics  IV ZOsyn for intrabdominal coverage   Hypokalemia, may be due to volume loss and poor oral intake  EKG SR, K 2.2 replenished in the ED  Oral replenishment and IVF  Received Mg  Repeat CMET in am   Hypothyroidism: Continue home Synthroid   Hypertension BP  156/81   Pulse 79   Continue home anti-hypertensive medications    Hyperlipidemia Continue home statins   Acute Kidney Injury likely due to dehydration vs.ACEI I. No confusion. Cr 1.36    Lab Results  Component Value Date   CREATININE 1.36 (H) 10/07/2017   CREATININE 1.43 (H) 08/19/2017   CREATININE 1.27 (H) 07/27/2017  IVF BMET in am  Hold Ace inhibitors today     DVT prophylaxis: Heparin subcu Code Status:    Full Family Communication:  Discussed with patient Disposition Plan: Expect patient to be discharged to home after condition improves Consults called:    GI by EDP, Dr.Bramhatt admission status: IP MEdsurg   Sharene Butters, PA-C [facility]   Amion  text  [phone]   09/20/2017, 3:21 PM

## 2017-09-13 NOTE — ED Notes (Signed)
Wife notified of patient's move to [facility]

## 2017-09-13 NOTE — Progress Notes (Signed)
Pharmacy Antibiotic Note  LOGEN HEINTZELMAN is a 77 y.o. male presenting with epigastric pain, possible intra-abdominal infection. Pharmacy has been consulted for Zosyn dosing. WBC 20.5, currently AF. Scr 1.36 (BL ~1-1.2), estimated CrCl ~45 mL/min.  Plan: Zosyn 3.375g IV q8h (4 hour infusion). F/u C&S, clinical status, renal function, de-escalation, LOT  Temp (24hrs), Avg:97.7 F (36.5 C), Min:97.7 F (36.5 C), Max:97.7 F (36.5 C)  Recent Labs  Lab 10/06/2017 1308  WBC 20.5*  CREATININE 1.36*    CrCl cannot be calculated (Unknown ideal weight.).    No Known Allergies  Antimicrobials this admission: Zosyn 5/7 >>  Microbiology results: 5/7 BCx: pending 5/7 GI panel: pending 5/7 C.diff: pending  Thank you for allowing pharmacy to be a part of this patient's care.  Mila Merry Gerarda Fraction, PharmD PGY1 Pharmacy Resident Pager: [phone] 10/06/2017 5:47 PM

## 2017-09-13 NOTE — ED Notes (Signed)
Pt's urine collected in UA cup at bedside. No orders for UA at this time.

## 2017-09-13 NOTE — Care Management (Signed)
77 year old man presents with a history significant for hypertension hyperlipidemia, chronic kidney disease, COPD, coronary artery disease with complaints of poor oral intake and a 40 pound weight loss.  Had unbearable symptoms of abdominal pain nausea vomiting and diarrhea.  EGD was done on April 12 which was unremarkable and a CT of the abdomen and pelvis were also unremarkable.  Dr. Trula Slade is planning to do a colonoscopy in the morning.  Full documentation to follow.

## 2017-09-13 NOTE — ED Notes (Signed)
Patient's wife reports that he has lost 40lbs since the 13th March

## 2017-09-13 NOTE — ED Notes (Signed)
Patient reports that the medicine that he received for GI  Has caused diarrhea and vomiting.

## 2017-09-14 ENCOUNTER — Inpatient Hospital Stay (HOSPITAL_COMMUNITY): Payer: Medicare Other

## 2017-09-14 ENCOUNTER — Other Ambulatory Visit: Payer: Self-pay

## 2017-09-14 LAB — BASIC METABOLIC PANEL
ANION GAP: 22 — AB (ref 5–15)
Anion gap: 21 — ABNORMAL HIGH (ref 5–15)
BUN: 18 mg/dL (ref 6–20)
BUN: 21 mg/dL — AB (ref 6–20)
CALCIUM: 9.5 mg/dL (ref 8.9–10.3)
CALCIUM: 9.8 mg/dL (ref 8.9–10.3)
CO2: 22 mmol/L (ref 22–32)
CO2: 24 mmol/L (ref 22–32)
CREATININE: 1.66 mg/dL — AB (ref 0.61–1.24)
Chloride: 93 mmol/L — ABNORMAL LOW (ref 101–111)
Chloride: 94 mmol/L — ABNORMAL LOW (ref 101–111)
Creatinine, Ser: 1.53 mg/dL — ABNORMAL HIGH (ref 0.61–1.24)
GFR calc Af Amer: 44 mL/min — ABNORMAL LOW (ref >60)
GFR calc non Af Amer: 38 mL/min — ABNORMAL LOW (ref >60)
GFR, EST AFRICAN AMERICAN: 48 mL/min — AB (ref >60)
GFR, EST NON AFRICAN AMERICAN: 42 mL/min — AB (ref >60)
GLUCOSE: 198 mg/dL — AB (ref 65–99)
GLUCOSE: 171 mg/dL — AB (ref 65–99)
Potassium: 2.4 mmol/L — CL (ref 3.5–5.1)
Potassium: 4.8 mmol/L (ref 3.5–5.1)
Sodium: 137 mmol/L (ref 135–145)
Sodium: 139 mmol/L (ref 135–145)

## 2017-09-14 LAB — CBC
HCT: 41.7 % (ref 39.0–52.0)
HEMOGLOBIN: 14.9 g/dL (ref 13.0–17.0)
MCH: 32.3 pg (ref 26.0–34.0)
MCHC: 35.7 g/dL (ref 30.0–36.0)
MCV: 90.5 fL (ref 78.0–100.0)
PLATELETS: 401 10*3/uL — AB (ref 150–400)
RBC: 4.61 MIL/uL (ref 4.22–5.81)
RDW: 14 % (ref 11.5–15.5)
WBC: 35.6 10*3/uL — ABNORMAL HIGH (ref 4.0–10.5)

## 2017-09-14 MED ORDER — POTASSIUM CHLORIDE 10 MEQ/100ML IV SOLN
10.0000 meq | INTRAVENOUS | Status: AC
  Administered 2017-09-14 (×3): 10 meq via INTRAVENOUS
  Filled 2017-09-14 (×2): qty 100

## 2017-09-14 MED ORDER — POTASSIUM CHLORIDE IN NACL 20-0.45 MEQ/L-% IV SOLN
INTRAVENOUS | Status: DC
  Filled 2017-09-14: qty 1000

## 2017-09-14 MED ORDER — SODIUM CHLORIDE 0.9 % IV SOLN
INTRAVENOUS | Status: DC
  Administered 2017-09-14: 12:00:00 via INTRAVENOUS

## 2017-09-14 MED ORDER — HYDROMORPHONE HCL 1 MG/ML IJ SOLN
0.5000 mg | INTRAMUSCULAR | Status: DC | PRN
  Administered 2017-09-14 (×2): 0.5 mg via INTRAVENOUS
  Filled 2017-09-14 (×2): qty 0.5

## 2017-09-14 MED ORDER — POTASSIUM CHLORIDE CRYS ER 20 MEQ PO TBCR
40.0000 meq | EXTENDED_RELEASE_TABLET | Freq: Once | ORAL | Status: AC
  Administered 2017-09-14: 40 meq via ORAL
  Filled 2017-09-14: qty 2

## 2017-09-14 MED ORDER — OXYCODONE HCL 5 MG PO TABS
5.0000 mg | ORAL_TABLET | ORAL | Status: DC | PRN
  Administered 2017-09-14: 5 mg via ORAL
  Filled 2017-09-14: qty 1

## 2017-09-14 MED ORDER — PANTOPRAZOLE SODIUM 40 MG PO TBEC
40.0000 mg | DELAYED_RELEASE_TABLET | Freq: Two times a day (BID) | ORAL | Status: DC
  Administered 2017-09-14 (×2): 40 mg via ORAL
  Filled 2017-09-14 (×2): qty 1

## 2017-09-14 MED ORDER — PREMIER PROTEIN SHAKE
11.0000 [oz_av] | Freq: Two times a day (BID) | ORAL | Status: DC
  Administered 2017-09-14: 11 [oz_av] via ORAL
  Filled 2017-09-14 (×5): qty 325.31

## 2017-09-14 MED ORDER — FAMOTIDINE IN NACL 20-0.9 MG/50ML-% IV SOLN
20.0000 mg | Freq: Two times a day (BID) | INTRAVENOUS | Status: DC
  Administered 2017-09-14 – 2017-09-15 (×3): 20 mg via INTRAVENOUS
  Filled 2017-09-14 (×3): qty 50

## 2017-09-14 NOTE — Consult Note (Addendum)
Referring Provider: [facility] Primary Care Physician:  Lavone Orn, MD Primary Gastroenterologist:  Dr.Kakri   Reason for Consultation:  Abdominal pain, nausea, vomiting and diarrhea.  HPI: Andre Maldonado is a 77 y.o. male with past medical history of coronary artery disease, peripheral vascular disease, presented to the hospital for further evaluation of abdominal pain. Intermittent abdominal pain for last few weeks. GI is consulted for further evaluation.  Patient had a multiple CT scan since 07/22/2017. Initial CT scan on 07/22/2017 showed mild diverticulitis in the sigmoid colon.repeat CT scan on 07/27/2017 again showed mild increased inflammation in the distal descending and proximal sigmoid colon concerning for diverticulitis. No abscess.CT scan on 08/19/2017 showed no evidence  of diverticulitis.patient currently has significant leukocytosis.remains afebrile.normal hemoglobin.LFT shows mild elevated alkaline phosphatase at 257.normal lipase.  Patient complaining of worsening epigastric and periumbilical abdominal pain area described as sharp stabbing pain. Also complaining of worsening back pain. Complaining of nausea. Had small amount of coffee-ground emesis yesterday. History of diarrhea which is resolved now. No bowel movement since yesterday. Denied any blood in the stool or black stool. Complaining of sore throat but denied any dysphagia or odynophagia.  EGD 08/18/2017 for evaluation of epigastric and generalized abdominal painby Dr. Therisa Doyne  showed long segment Barrett's esophagus. Clean-based gastric ulcers. Biopsies negative for H. Pylori.  Past Medical History:  Diagnosis Date  . Arthritis    hands, elbows, knees  . Bladder cancer [facility])    urologist-  [name]  . Coronary arteriosclerosis in native artery    s/p  cabg x1  1981--  Cardiologist-- dr [name]  LOV 02-05-2009  . Hyperlipidemia   . Hypertension   . Hypothyroidism   . Nocturia   . Peripheral vascular disease ([place])     per duplex 03-05-2014  occluded left  SFA  distal level reconstitution at the popliteal  . 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 Wears dentures    upper  . Wears glasses     Past Surgical History:  Procedure Laterality Date  . ANTERIOR CERVICAL DECOMP/DISCECTOMY FUSION  02/19/2009   C5 -- C6  . CORONARY ARTERY BYPASS GRAFT  1981   in [place] , [place]   x1  LIMA to LAD  . CYSTOSCOPY WITH BIOPSY N/A 06/14/2016   Procedure: CYSTOSCOPY WITH BIOPSY;  Surgeon: [name], MD;  Location: [facility];  Service: Urology;  Laterality: N/A;  . KNEE SURGERY Left 1976  . TRANSURETHRAL RESECTION OF BLADDER TUMOR N/A 04/12/2016   Procedure: TRANSURETHRAL RESECTION OF BLADDER TUMOR (TURBT) WITH EPIRUBICIN  INSTILLATION;  Surgeon: [name], MD;  Location: [facility];  Service: Urology;  Laterality: N/A;    Prior to Admission medications   Medication Sig Start Date End Date Taking? Authorizing Provider  acetaminophen (TYLENOL) 325 MG tablet Take 650 mg by mouth every 6 (six) hours as needed for mild pain.   Yes [provider]  alum & mag hydroxide-simeth (MAALOX/MYLANTA) 200-200-20 MG/5ML suspension Take 15 mLs by mouth every 6 (six) hours as needed for indigestion or heartburn.   Yes [provider]  Ca Carbonate-Mag Hydroxide (ROLAIDS PO) Take 1 tablet by mouth daily as needed (indigestion).   Yes [provider]  calcium carbonate (TUMS - DOSED IN MG ELEMENTAL CALCIUM) 500 MG chewable tablet Chew 1 tablet by mouth daily as needed for indigestion or heartburn.   Yes [provider]  levothyroxine (SYNTHROID, LEVOTHROID) 100 MCG tablet Take 100  mcg by mouth daily before breakfast.   Yes [provider]  losartan-hydrochlorothiazide (HYZAAR) 100-12.5 MG tablet Take 1 tablet by mouth every morning.   Yes [provider]  metoprolol (LOPRESSOR) 50 MG tablet Take 25 mg by mouth 2 (two) times daily.    Yes  [provider]  simvastatin (ZOCOR) 20 MG tablet Take 20 mg by mouth every morning.    Yes [provider]    Scheduled Meds: . heparin  5,000 Units Subcutaneous Q8H  . levothyroxine  100 mcg Oral QAC breakfast  . simvastatin  20 mg Oral q morning - 10a   Continuous Infusions: . 0.45 % NaCl with KCl 20 mEq / L    . famotidine (PEPCID) IV 20 mg (09/14/17 0929)  . piperacillin-tazobactam (ZOSYN)  IV 3.375 g (09/14/17 0929)   PRN Meds:.acetaminophen **OR** acetaminophen, bisacodyl, HYDROcodone-acetaminophen, morphine injection, ondansetron **OR** ondansetron (ZOFRAN) IV  Allergies as of 09/14/2017  . (No Known Allergies)    No family history on file.  Social History   Socioeconomic History  . Marital status: Married    Spouse name: Not on file  . Number of children: Not on file  . Years of education: Not on file  . Highest education level: Not on file  Occupational History  . Not on file  Social Needs  . Financial resource strain: Not on file  . Food insecurity:    Worry: Not on file    Inability: Not on file  . Transportation needs:    Medical: Not on file    Non-medical: Not on file  Tobacco Use  . Smoking status: Former Smoker    Years: 30.00    Types: Cigarettes    Last attempt to quit: 04/08/1985    Years since quitting: 32.4  . Smokeless tobacco: Never Used  Substance and Sexual Activity  . Alcohol use: Yes    Comment: occasional  . Drug use: No  . Sexual activity: Not on file  Lifestyle  . Physical activity:    Days per week: Not on file    Minutes per session: Not on file  . Stress: Not on file  Relationships  . Social connections:    Talks on phone: Not on file    Gets together: Not on file    Attends religious service: Not on file    Active member of club or organization: Not on file    Attends meetings of clubs or organizations: Not on file    Relationship status: Not on file  . Intimate partner violence:    Fear of current  or ex partner: Not on file    Emotionally abused: Not on file    Physically abused: Not on file    Forced sexual activity: Not on file  Other Topics Concern  . Not on file  Social History Narrative  . Not on file    Review of Systems: Review of Systems  Constitutional: Positive for malaise/fatigue. Negative for chills and fever.  HENT: Positive for sore throat. Negative for hearing loss and tinnitus.   Eyes: Negative for blurred vision and double vision.  Respiratory: Negative for cough and hemoptysis.   Cardiovascular: Negative for chest pain and palpitations.  Gastrointestinal: Positive for abdominal pain, diarrhea, heartburn, nausea and vomiting. Negative for blood in stool and melena.  Genitourinary: Negative for dysuria and urgency.  Musculoskeletal: Positive for back pain. Negative for falls.  Skin: Negative for itching and rash.  Neurological: Negative for seizures and loss  of consciousness.  Endo/Heme/Allergies: Does not bruise/bleed easily.  Psychiatric/Behavioral: Negative for hallucinations and suicidal ideas.    Physical Exam: Vital signs: Vitals:   09/14/2017 2200 09/14/17 0603  BP: (!) 147/65 97/71  Pulse: (!) 106 92  Resp:  18  Temp:  97.9 F (36.6 C)  SpO2:  99%   Last BM Date: 09/29/2017 Physical Exam  Constitutional: He is oriented to person, place, and time. He appears well-developed and well-nourished.  HENT:  Head: Normocephalic and atraumatic.  Mouth/Throat: Oropharynx is clear and moist. No oropharyngeal exudate.  Eyes: EOM are normal. No scleral icterus.  Neck: Normal range of motion. Neck supple.  Cardiovascular: Normal rate, regular rhythm and normal heart sounds.  Pulmonary/Chest: Effort normal and breath sounds normal. No respiratory distress.  Abdominal: Soft. Bowel sounds are normal. He exhibits no distension. There is tenderness. There is guarding. There is no rebound.  Abdomen is soft. Epigastric and periumbilical tenderness with guarding. No  rebound.  Musculoskeletal: Normal range of motion. He exhibits no edema.  Neurological: He is alert and oriented to person, place, and time.  Skin: Skin is warm. No erythema.  Psychiatric: He has a normal mood and affect. Judgment normal.  Vitals reviewed.   GI:  Lab Results: Recent Labs    09/27/2017 1308 09/14/17 0411  WBC 20.5* 35.6*  HGB 14.6 14.9  HCT 39.5 41.7  PLT 370 401*   BMET Recent Labs    09/28/2017 1308 09/18/2017 2200 09/14/17 0411  NA 135 137 137  K 2.2* 2.8* 2.4*  CL 92* 93* 93*  CO2 27 25 22   GLUCOSE 176* 185* 198*  BUN 17 17 18   CREATININE 1.36* 1.38* 1.53*  CALCIUM 8.9 9.2 9.5   LFT Recent Labs    10/02/2017 1308  PROT 6.6  ALBUMIN 2.8*  AST 36  ALT 22  ALKPHOS 257*  BILITOT 0.8   PT/INR No results for input(s): LABPROT, INR in the last 72 hours.   Studies/Results: Dg Abd Portable 1v  Result Date: 09/14/2017 CLINICAL DATA:  Nausea and vomiting EXAM: PORTABLE ABDOMEN - 1 VIEW COMPARISON:  08/19/2017 FINDINGS: Scattered large and small bowel gas is noted. Some mild small bowel dilatation is noted which may represent small bowel ileus or partial small bowel obstruction. Correlation with the physical exam is recommended. No abnormal mass or abnormal calcifications are seen. No free air is noted. Degenerative change of the lumbar spine is noted. IMPRESSION: Mild small bowel dilatation with evidence of air within the colon. These changes may represent a mild ileus or partial small bowel obstruction. Correlation with the physical exam is recommended. Electronically Signed   By: Inez Catalina M.D.   On: 09/14/2017 07:36    Impression/Plan: - worsening epigastric and periumbilical abdominal pain. Patient with intermittent abdominal pain for several weeks.X-ray concerning for ileus versus partial small bowel obstruction. Patient with worsening leukocytosis. Etiology of patient's abdominal pain is unknown. Normal lipase. - Nausea, vomiting, coffee-ground  emesis. No vomiting today. No black tarry stool. EGD last month showed clean-based gastric ulcers. Biopsies negative for H. Pylori. Hemoglobin stable - Diarrhea. Resolved. - history of diverticulitis.  07/2017   Recommendations ------------------------- - patient with ongoing abdominal pain. Worsening leukocytosis. Although, patient had multiple CT scan in the past, given his current clinical status, he may need another CT scan abdomen pelvis with IV contrast once kidney function improves. If clinical condition get worse, he may need CT abdomen pelvis without contrast. - start IV hydration with 150 mL normal  saline per hour. - start PPI. - no vomiting today. Patient is requesting full liquid diet. If he develops nausea and vomiting with the diet, recommend keeping him nothing by mouth. - Recheck CBC and CMP in the morning. - follow GI pathogen panel and C Diff  - GI will follow.    LOS: 1 day   Otis Brace  MD, FACP 09/14/2017, 10:41 AM  Contact #  [phone]

## 2017-09-14 NOTE — Progress Notes (Signed)
Initial Nutrition Assessment  DOCUMENTATION CODES:   Obesity unspecified  INTERVENTION:  Premier Protein BID, each supplement provides 160 calories and 30 grams of protein  Monitor for diet advancement and tolerance  NUTRITION DIAGNOSIS:   Severe Malnutrition related to acute illness as evidenced by percent weight loss, energy intake < or equal to 50% for > or equal to 5 days, moderate muscle depletion, moderate fat depletion.  GOAL:   Patient will meet greater than or equal to 90% of their needs  MONITOR:   PO intake, Supplement acceptance, Weight trends, I & O's  REASON FOR ASSESSMENT:   Malnutrition Screening Tool    ASSESSMENT:   77 year old man presents with a history significant for hypertension hyperlipidemia, chronic kidney disease, COPD, coronary artery disease with complaints of poor oral intake and a 40 pound weight loss.  Had unbearable symptoms of abdominal pain nausea vomiting and diarrhea.  EGD was done on April 12 which was unremarkable and a CT of the abdomen and pelvis were also unremarkable.   Patient seen for positive MST. Spoke with Mr. Noffke at bedside. He complains of nausea, abdominal pain, vomiting, and diarrhea since Mar 12th with a 40 pound/17.7% weight loss since then. UBW of 225 pounds.  He states he was trying to eat normally (3 meals a day, some snacks) but couldn't keep anything down. Next he tried to eat 3-4 small meals a day, but continued to vomit.  Patient states he has eaten very little for the past two months.  He denies nausea today but continues to have severe abdominal pain. Per Gi X-ray is concerning for ileus versus partial SBO. May need CT with contrast if his kidney function improves.  Now advanced to full liquids. Monitor tolerance. Given <50% energy intake > 5 days, percent weight loss, and muscle wasting and fat depletions, he meets criteria for severe malnutrition in context of acute illness.  Labs reviewed:  BUN/Creatinine  21/1.66  Medications reviewed and include:  NS at 138mL/hr  NUTRITION - FOCUSED PHYSICAL EXAM:    Most Recent Value  Orbital Region  Mild depletion  Upper Arm Region  Moderate depletion  Thoracic and Lumbar Region  Mild depletion  Buccal Region  Mild depletion  Temple Region  Moderate depletion  Clavicle Bone Region  Moderate depletion  Clavicle and Acromion Bone Region  Moderate depletion  Scapular Bone Region  Moderate depletion  Dorsal Hand  Mild depletion  Patellar Region  Moderate depletion  Anterior Thigh Region  Moderate depletion  Posterior Calf Region  Moderate depletion       Diet Order:   Diet Order           Diet full liquid Room service appropriate? Yes; Fluid consistency: Thin  Diet effective now          EDUCATION NEEDS:   Not appropriate for education at this time  Skin:  Skin Assessment: Reviewed RN Assessment  Last BM:  09/07/2017  Height:   Ht Readings from Last 1 Encounters:  06/09/16 5\' 10"  (1.778 m)    Weight:   Wt Readings from Last 1 Encounters:  09/30/2017 190 lb 4.1 oz (86.3 kg)    Ideal Body Weight:  75.45 kg  BMI:  Body mass index is 27.3 kg/m.  Estimated Nutritional Needs:   Kcal:  1900-2200 calories (MSJ x1.2-1.4)  Protein:  104-130 grams  Fluid:  >2L  Satira Anis. Durante Violett, MS, RD LDN Inpatient Clinical Dietitian Pager [phone]

## 2017-09-14 NOTE — Progress Notes (Signed)
Called by Nursing staff regarding pt due to a fall and LOC change.  Upon arrival, pt was back lying in the bed.  Alert, oriented x4 with minimal delay in answers.  No focal neurological deficits.  Pt. Denies pain and SOB. Skin pale and diaphoretic.  Palpable peripheral pulses with cap refill of > 3 secs.  Alger Memos APP notified of events and orders received.  NS bolus initiated.  EKG irregular and 12 lead EKG obtained >>ST with no acute changes.  Alger Memos APP updated over the phone.  Orders received for CT scan and orthostatic BPs.  Contracted with nursing staff plan of care.    HR 108 ST, BP 145/102 with bolus infusing, RR 24, Sats 100% on 2LNC.  CBG 127.

## 2017-09-14 NOTE — Progress Notes (Signed)
. [facility] Progress Note  Patient: Andre Maldonado KZL:[id number]   PCP: Lavone Orn, MD DOB: [birth year]   DOA: 09/19/2017   DOS: 09/14/2017   Date of Service: the patient was seen and examined on 09/14/2017  Subjective: Continues to have nausea and abdominal pain.  Last episode of vomiting was last night.  No bowel movement.  Passing gas.  Brief hospital course: Pt. with PMH of bladder cancer, CAD S/P CABG, HTN, PVD, gastric ulcer; admitted on 09/12/2017, presented with complaint of abdominal pain nausea and vomiting, was found to have partial small bowel obstruction, unknown etiology. Currently further plan is continue current management.  Assessment and Plan: Intractable abdominal pain, nausea, vomiting and diarrhea in a patient with a recent history of colitis, gastric ulcer, Barrett's esophagus.  After receiving IV Zofran, pain medication, IV fluids, symptoms are slowly improving, but not resolved.  The patient is afebrile.  His white count is 20.5, and the setting of inflammation, and pain.  Last loose stool studies afternoon, last episode of vomiting this morning.  The patient continues to have dry heaves.  Stool cultures pending Inpatient MedSurg IVF with K replenishment Follow stool culture result full liquid diet and advance as tolerated GI to consult, appreciate involvement Continue IV antiemetics  IV ZOsyn for intrabdominal coverage  although low threshold to discontinue it.  Hypokalemia, may be due to volume loss and poor oral intake  EKG SR, K 2.2 replenished in the ED  Oral replenishment and IVF  Received Mg  Repeat CMET in am   Hypothyroidism: Continue home Synthroid   Hypertension BP  156/81   Pulse 79   Continue home anti-hypertensive medications    Hyperlipidemia Continue home statins   Acute Kidney Injury likely due to dehydration vs.ACEI I. No confusion.  Continue IV hydration.   Diet: full liquid DVT Prophylaxis: subcutaneous  Heparin  Advance goals of care discussion: full code  Family Communication: no family was present at bedside, at the time of interview.  Disposition:  Discharge to be determined.  Consultants: gastroenterology  Procedures: none  Antibiotics: Anti-infectives (From admission, onward)   Start     Dose/Rate Route Frequency Ordered Stop   09/14/17 0200  piperacillin-tazobactam (ZOSYN) IVPB 3.375 g     3.375 g 12.5 mL/hr over 240 Minutes Intravenous Every 8 hours 09/12/2017 1755     09/23/2017 1800  piperacillin-tazobactam (ZOSYN) IVPB 3.375 g     3.375 g 100 mL/hr over 30 Minutes Intravenous  Once 09/19/2017 1754 09/25/2017 1844       Objective: Physical Exam: Vitals:   09/22/2017 2125 09/16/2017 2200 09/14/17 0603 09/14/17 1415  BP: (!) 147/65 (!) 147/65 97/71 (!) 177/80  Pulse: (!) 106 (!) 106 92 96  Resp: 18  18 18   Temp: 97.9 F (36.6 C)  97.9 F (36.6 C) 98.2 F (36.8 C)  TempSrc: Oral  Oral   SpO2: 98%  99% 96%  Weight:        Intake/Output Summary (Last 24 hours) at 09/14/2017 1759 Last data filed at 09/14/2017 1300 Gross per 24 hour  Intake 550 ml  Output 450 ml  Net 100 ml   Filed Weights   09/12/2017 1858  Weight: 86.3 kg (190 lb 4.1 oz)   General: Alert, Awake and Oriented to Time, Place and Person. Appear in moderate distress, affect appropriate Eyes: PERRL, Conjunctiva normal ENT: Oral Mucosa clear moist. Neck: no JVD, no Abnormal Mass Or lumps Cardiovascular: S1 and S2 Present, no Murmur, Peripheral Pulses Present  Respiratory: normal respiratory effort, Bilateral Air entry equal and Decreased, no use of accessory muscle, Clear to Auscultation, no Crackles, no wheezes Abdomen: Bowel Sound present, Soft and lower abdominal  tenderness, no hernia Skin: no redness, no Rash, no induration Extremities: no Pedal edema, no calf tenderness Neurologic: Grossly no focal neuro deficit. Bilaterally Equal motor strength  Data Reviewed: CBC: Recent Labs  Lab 09/19/2017 1308  09/14/17 0411  WBC 20.5* 35.6*  NEUTROABS 18.1*  --   HGB 14.6 14.9  HCT 39.5 41.7  MCV 88.0 90.5  PLT 370 740*   Basic Metabolic Panel: Recent Labs  Lab 09/26/2017 1308 09/26/2017 2200 09/14/17 0411 09/14/17 1427  NA 135 137 137 139  K 2.2* 2.8* 2.4* 4.8  CL 92* 93* 93* 94*  CO2 27 25 22 24   GLUCOSE 176* 185* 198* 171*  BUN 17 17 18  21*  CREATININE 1.36* 1.38* 1.53* 1.66*  CALCIUM 8.9 9.2 9.5 9.8  MG 2.0  --   --   --     Liver Function Tests: Recent Labs  Lab 09/11/2017 1308  AST 36  ALT 22  ALKPHOS 257*  BILITOT 0.8  PROT 6.6  ALBUMIN 2.8*   Recent Labs  Lab 10/04/2017 1308  LIPASE 25   No results for input(s): AMMONIA in the last 168 hours. Coagulation Profile: No results for input(s): INR, PROTIME in the last 168 hours. Cardiac Enzymes: No results for input(s): CKTOTAL, CKMB, CKMBINDEX, TROPONINI in the last 168 hours. BNP (last 3 results) No results for input(s): PROBNP in the last 8760 hours. CBG: No results for input(s): GLUCAP in the last 168 hours. Studies: Dg Abd Portable 1v  Result Date: 09/14/2017 CLINICAL DATA:  Nausea and vomiting EXAM: PORTABLE ABDOMEN - 1 VIEW COMPARISON:  08/19/2017 FINDINGS: Scattered large and small bowel gas is noted. Some mild small bowel dilatation is noted which may represent small bowel ileus or partial small bowel obstruction. Correlation with the physical exam is recommended. No abnormal mass or abnormal calcifications are seen. No free air is noted. Degenerative change of the lumbar spine is noted. IMPRESSION: Mild small bowel dilatation with evidence of air within the colon. These changes may represent a mild ileus or partial small bowel obstruction. Correlation with the physical exam is recommended. Electronically Signed   By: Inez Catalina M.D.   On: 09/14/2017 07:36    Scheduled Meds: . heparin  5,000 Units Subcutaneous Q8H  . levothyroxine  100 mcg Oral QAC breakfast  . pantoprazole  40 mg Oral BID  . protein  supplement shake  11 oz Oral BID BM   Continuous Infusions: . sodium chloride 150 mL/hr at 09/14/17 1224  . famotidine (PEPCID) IV Stopped (09/14/17 8144)  . piperacillin-tazobactam (ZOSYN)  IV 3.375 g (09/14/17 1709)   PRN Meds: acetaminophen **OR** acetaminophen, bisacodyl, HYDROmorphone (DILAUDID) injection, ondansetron **OR** ondansetron (ZOFRAN) IV, oxyCODONE  Time spent: 35 minutes  Author: Berle Mull, MD [facility] Pager: [phone] 09/14/2017 5:59 PM  If 7PM-7AM, please contact night-coverage at [email or web address], password [facility]

## 2017-09-15 ENCOUNTER — Inpatient Hospital Stay (HOSPITAL_COMMUNITY): Payer: Medicare Other

## 2017-09-15 DIAGNOSIS — Z515 Encounter for palliative care: Secondary | ICD-10-CM

## 2017-09-15 DIAGNOSIS — R40243 Glasgow coma scale score 3-8, unspecified time: Secondary | ICD-10-CM

## 2017-09-15 DIAGNOSIS — J9601 Acute respiratory failure with hypoxia: Secondary | ICD-10-CM

## 2017-09-15 DIAGNOSIS — R579 Shock, unspecified: Secondary | ICD-10-CM

## 2017-09-15 DIAGNOSIS — R112 Nausea with vomiting, unspecified: Secondary | ICD-10-CM

## 2017-09-15 DIAGNOSIS — R1013 Epigastric pain: Secondary | ICD-10-CM

## 2017-09-15 DIAGNOSIS — A419 Sepsis, unspecified organism: Principal | ICD-10-CM

## 2017-09-15 DIAGNOSIS — R197 Diarrhea, unspecified: Secondary | ICD-10-CM

## 2017-09-15 DIAGNOSIS — Z7189 Other specified counseling: Secondary | ICD-10-CM

## 2017-09-15 DIAGNOSIS — R0603 Acute respiratory distress: Secondary | ICD-10-CM

## 2017-09-15 DIAGNOSIS — R6521 Severe sepsis with septic shock: Secondary | ICD-10-CM

## 2017-09-15 DIAGNOSIS — W19XXXA Unspecified fall, initial encounter: Secondary | ICD-10-CM

## 2017-09-15 LAB — CBC WITH DIFFERENTIAL/PLATELET
BAND NEUTROPHILS: 0 %
BASOS PCT: 0 %
Basophils Absolute: 0 10*3/uL (ref 0.0–0.1)
Blasts: 0 %
EOS PCT: 0 %
Eosinophils Absolute: 0 10*3/uL (ref 0.0–0.7)
HEMATOCRIT: 44.4 % (ref 39.0–52.0)
Hemoglobin: 15 g/dL (ref 13.0–17.0)
LYMPHS ABS: 0.4 10*3/uL — AB (ref 0.7–4.0)
Lymphocytes Relative: 1 %
MCH: 32.3 pg (ref 26.0–34.0)
MCHC: 33.8 g/dL (ref 30.0–36.0)
MCV: 95.7 fL (ref 78.0–100.0)
METAMYELOCYTES PCT: 0 %
MONO ABS: 1.5 10*3/uL — AB (ref 0.1–1.0)
MONOS PCT: 4 %
MYELOCYTES: 0 %
NEUTROS ABS: 35.4 10*3/uL — AB (ref 1.7–7.7)
Neutrophils Relative %: 95 %
PLATELETS: 361 10*3/uL (ref 150–400)
Promyelocytes Relative: 0 %
RBC: 4.64 MIL/uL (ref 4.22–5.81)
RDW: 15 % (ref 11.5–15.5)
WBC Morphology: INCREASED
WBC: 37.3 10*3/uL — ABNORMAL HIGH (ref 4.0–10.5)
nRBC: 0 /100 WBC

## 2017-09-15 LAB — LACTIC ACID, PLASMA: Lactic Acid, Venous: 13.4 mmol/L (ref 0.5–1.9)

## 2017-09-15 LAB — BLOOD GAS, ARTERIAL
Acid-base deficit: 16.1 mmol/L — ABNORMAL HIGH (ref 0.0–2.0)
Bicarbonate: 12.9 mmol/L — ABNORMAL LOW (ref 20.0–28.0)
DRAWN BY: 511471
FIO2: 100
O2 CONTENT: 15 L/min
O2 SAT: 94.2 %
Patient temperature: 97.6
pCO2 arterial: 51.5 mmHg — ABNORMAL HIGH (ref 32.0–48.0)
pH, Arterial: 7.022 — CL (ref 7.350–7.450)
pO2, Arterial: 107 mmHg (ref 83.0–108.0)

## 2017-09-15 LAB — POCT I-STAT 3, ART BLOOD GAS (G3+)
Acid-base deficit: 20 mmol/L — ABNORMAL HIGH (ref 0.0–2.0)
BICARBONATE: 9.8 mmol/L — AB (ref 20.0–28.0)
O2 SAT: 81 %
PCO2 ART: 32.8 mmHg (ref 32.0–48.0)
PO2 ART: 57 mmHg — AB (ref 83.0–108.0)
Patient temperature: 95.8
TCO2: 11 mmol/L — AB (ref 22–32)
pH, Arterial: 7.075 — CL (ref 7.350–7.450)

## 2017-09-15 LAB — GLUCOSE, CAPILLARY
GLUCOSE-CAPILLARY: 144 mg/dL — AB (ref 65–99)
GLUCOSE-CAPILLARY: 35 mg/dL — AB (ref 65–99)
Glucose-Capillary: 127 mg/dL — ABNORMAL HIGH (ref 65–99)
Glucose-Capillary: 31 mg/dL — CL (ref 65–99)
Glucose-Capillary: 96 mg/dL (ref 65–99)
Glucose-Capillary: 77 mg/dL (ref 65–99)

## 2017-09-15 LAB — COMPREHENSIVE METABOLIC PANEL
ALBUMIN: 2.3 g/dL — AB (ref 3.5–5.0)
ALT: 1547 U/L — ABNORMAL HIGH (ref 17–63)
AST: 2202 U/L — AB (ref 15–41)
Alkaline Phosphatase: 404 U/L — ABNORMAL HIGH (ref 38–126)
Anion gap: 27 — ABNORMAL HIGH (ref 5–15)
BUN: 26 mg/dL — AB (ref 6–20)
CHLORIDE: 97 mmol/L — AB (ref 101–111)
CO2: 16 mmol/L — AB (ref 22–32)
CREATININE: 2.57 mg/dL — AB (ref 0.61–1.24)
Calcium: 9.4 mg/dL (ref 8.9–10.3)
GFR calc Af Amer: 26 mL/min — ABNORMAL LOW (ref >60)
GFR, EST NON AFRICAN AMERICAN: 22 mL/min — AB (ref >60)
GLUCOSE: 61 mg/dL — AB (ref 65–99)
POTASSIUM: 3.9 mmol/L (ref 3.5–5.1)
Sodium: 140 mmol/L (ref 135–145)
Total Bilirubin: 1.6 mg/dL — ABNORMAL HIGH (ref 0.3–1.2)
Total Protein: 5.8 g/dL — ABNORMAL LOW (ref 6.5–8.1)

## 2017-09-15 LAB — MAGNESIUM: Magnesium: 3.3 mg/dL — ABNORMAL HIGH (ref 1.7–2.4)

## 2017-09-15 MED ORDER — MIDAZOLAM HCL 2 MG/2ML IJ SOLN
INTRAMUSCULAR | Status: AC
  Filled 2017-09-15: qty 2

## 2017-09-15 MED ORDER — SODIUM BICARBONATE 8.4 % IV SOLN
INTRAVENOUS | Status: AC
  Administered 2017-09-15: 50 meq
  Filled 2017-09-15: qty 50

## 2017-09-15 MED ORDER — FENTANYL 2500MCG IN NS 250ML (10MCG/ML) PREMIX INFUSION
25.0000 ug/h | INTRAVENOUS | Status: DC
  Administered 2017-09-15: 50 ug/h via INTRAVENOUS
  Filled 2017-09-15: qty 250

## 2017-09-15 MED ORDER — SODIUM BICARBONATE 8.4 % IV SOLN
INTRAVENOUS | Status: AC
  Administered 2017-09-15: 50 meq via INTRAVENOUS
  Filled 2017-09-15: qty 50

## 2017-09-15 MED ORDER — DEXTROSE 5 % IV SOLN
0.0000 ug/min | INTRAVENOUS | Status: DC
  Filled 2017-09-15: qty 16

## 2017-09-15 MED ORDER — MORPHINE 100MG IN NS 100ML (1MG/ML) PREMIX INFUSION
10.0000 mg/h | INTRAVENOUS | Status: DC
  Administered 2017-09-15: 10 mg/h via INTRAVENOUS
  Filled 2017-09-15: qty 100

## 2017-09-15 MED ORDER — SODIUM BICARBONATE 8.4 % IV SOLN
50.0000 meq | Freq: Once | INTRAVENOUS | Status: AC
  Administered 2017-09-15: 50 meq via INTRAVENOUS

## 2017-09-15 MED ORDER — MORPHINE BOLUS VIA INFUSION
5.0000 mg | INTRAVENOUS | Status: DC | PRN
  Filled 2017-09-15: qty 20

## 2017-09-15 MED ORDER — ROCURONIUM BROMIDE 50 MG/5ML IV SOLN
50.0000 mg | INTRAVENOUS | Status: AC
  Administered 2017-09-15: 10 mg via INTRAVENOUS
  Filled 2017-09-15: qty 5

## 2017-09-15 MED ORDER — SODIUM CHLORIDE 0.9 % IV BOLUS
1000.0000 mL | Freq: Once | INTRAVENOUS | Status: AC
  Administered 2017-09-15: 1000 mL via INTRAVENOUS

## 2017-09-15 MED ORDER — MIDAZOLAM HCL 2 MG/2ML IJ SOLN: 1.0000 mg | INTRAMUSCULAR | Status: DC | PRN

## 2017-09-15 MED ORDER — VASOPRESSIN 20 UNIT/ML IV SOLN
0.0300 [IU]/min | INTRAVENOUS | Status: DC
  Administered 2017-09-15: 0.03 [IU]/min via INTRAVENOUS
  Filled 2017-09-15 (×2): qty 2

## 2017-09-15 MED ORDER — IOPAMIDOL (ISOVUE-370) INJECTION 76%
INTRAVENOUS | Status: AC
  Administered 2017-09-15: 100 mL
  Filled 2017-09-15: qty 100

## 2017-09-15 MED ORDER — DEXTROSE 50 % IV SOLN
INTRAVENOUS | Status: AC
  Administered 2017-09-15: 06:00:00
  Filled 2017-09-15: qty 50

## 2017-09-15 MED ORDER — SODIUM CHLORIDE 0.9 % IV SOLN: INTRAVENOUS | Status: DC | PRN

## 2017-09-15 MED ORDER — SODIUM CHLORIDE 0.9 % IV BOLUS: 500.0000 mL | Freq: Once | INTRAVENOUS | Status: DC | PRN

## 2017-09-15 MED ORDER — ETOMIDATE 2 MG/ML IV SOLN
20.0000 mg | INTRAVENOUS | Status: AC
  Administered 2017-09-15: 20 mg via INTRAVENOUS

## 2017-09-15 MED ORDER — FENTANYL CITRATE (PF) 100 MCG/2ML IJ SOLN: 50.0000 ug | Freq: Once | INTRAMUSCULAR | Status: DC

## 2017-09-15 MED ORDER — FENTANYL BOLUS VIA INFUSION
25.0000 ug | INTRAVENOUS | Status: DC | PRN
  Filled 2017-09-15: qty 25

## 2017-09-15 MED ORDER — FAMOTIDINE IN NACL 20-0.9 MG/50ML-% IV SOLN: 20.0000 mg | INTRAVENOUS | Status: DC

## 2017-09-15 MED ORDER — MIDAZOLAM HCL 2 MG/2ML IJ SOLN
1.0000 mg | INTRAMUSCULAR | Status: DC | PRN
Start: 2017-09-15 — End: 2017-09-15

## 2017-09-15 MED ORDER — DEXTROSE 5 % IV SOLN
730.0000 mg | Freq: Once | INTRAVENOUS | Status: DC
  Filled 2017-09-15: qty 14.6

## 2017-09-15 MED ORDER — NOREPINEPHRINE BITARTRATE 1 MG/ML IV SOLN
0.0000 ug/min | INTRAVENOUS | Status: DC
  Administered 2017-09-15: 10 ug/min via INTRAVENOUS
  Filled 2017-09-15 (×2): qty 4

## 2017-09-18 LAB — CULTURE, BLOOD (ROUTINE X 2)
CULTURE: NO GROWTH
Culture: NO GROWTH
SPECIAL REQUESTS: ADEQUATE
Special Requests: ADEQUATE

## 2017-09-19 ENCOUNTER — Telehealth: Payer: Self-pay

## 2017-09-19 NOTE — Telephone Encounter (Signed)
On 09/19/17 I received a d/c from [facility] [employer] (original). The d/c is for cremation. The patient is a patient of Doctor Nelda Marseille.  The d/c will be taken to [facility] [address].  On 09/20/17 I received the d/c back from Doctor Nelda Marseille. I got the d/c ready and called the funeral home to let them know the d/c is ready for pickup. I also faxed a copy to the funeral home per the funeral home request.

## 2017-10-08 NOTE — Progress Notes (Signed)
I spoke with Medical Examiner Izora Ribas. Patient was accepted as a ME case due to fall during admission on 5W04. Endotrachel tube removed before patient determined to be a ME case, but all other lines and devices were left in place. Patient transferred to morgue.

## 2017-10-08 NOTE — Progress Notes (Signed)
09/14/17 2109 09/14/17 2220 09/14/17 2230  What Happened  Was fall witnessed?  --  No  --   Was patient injured?  --  No  --   Patient found  --  in bathroom  --   Found by  --  Staff-comment Donald Prose RN)  --   Stated prior activity  --  bathroom-unassisted  --   Follow Up  MD notified  --  [place]  --   Time MD notified  --  2230  --   Family notified  --  No- patient refusal  --   Additional tests  --  Yes-comment (ct head)  --   Progress note created (see row info)  --  Yes  --   Adult Fall Risk Assessment  Risk Factor Category (scoring not indicated)  --  High fall risk per protocol (document High fall risk);Fall has occurred during this admission (document High fall risk)  --   Patient's Fall Risk  --  High Fall Risk (>13 points)  --   Adult Fall Risk Interventions  Required Bundle Interventions *See Row Information*  --  High fall risk - low, moderate, and high requirements implemented  --   Additional Interventions  --  Use of appropriate toileting equipment (bedpan, BSC, etc.)  --   Screening for Fall Injury Risk (To be completed on HIGH fall risk patients) - Assessing Need for Low Bed  Risk For Fall Injury- Low Bed Criteria  --  None identified - Continue screening  --   Screening for Fall Injury Risk (To be completed on HIGH fall risk patients who do not meet crieteria for Low Bed) - Assessing Need for Floor Mats Only  Risk For Fall Injury- Criteria for Floor Mats  --  None identified - No additional interventions needed  --   Vitals  Temp 98 F (36.7 C)  --   --   Temp Source Oral  --   --   BP 136/73 (!) 73/51  --   MAP (mmHg) 90  --   --   BP Location Left Arm  --   --   BP Method Automatic  --   --   Patient Position (if appropriate) Lying  --   --   Pulse Rate 99 (!) 130  --   Resp 18 18  --   Oxygen Therapy  SpO2 97 % (!) 77 %  --   O2 Device Room Air Room Air  --   Pain Assessment  Pain Scale 0-10 0-10  --   Pain Score 0 0  --   PCA/Epidural/Spinal  Assessment  Respiratory Pattern  --   --  Regular  Neurological  Neuro (WDL)  --   --  X  Level of Consciousness  --   --  Alert  Orientation Level  --   --  Oriented to person;Disoriented to person;Disoriented to place;Disoriented to time  Cognition  --   --  Poor safety awareness  Speech  --   --  Slurred/Dysarthria  Pupil Assessment   --   --  Yes  R Pupil Size (mm)  --   --  3  R Pupil Shape  --   --  Round  R Pupil Reaction  --   --  Brisk  L Pupil Size (mm)  --   --  3  L Pupil Shape  --   --  Round  L Pupil Reaction  --   --  Brisk  Motor Function/Sensation Assessment  --   --  Grip  R Hand Grip  --   --  Present;Moderate  L Hand Grip  --   --  Present;Moderate   Neuro Symptoms  --   --  Forgetful  Musculoskeletal  Musculoskeletal (WDL)  --  X  --   Generalized Weakness  --  Yes  --   Integumentary  Integumentary (WDL)  --  WDL WDL      Patient left in bathroom by himself. Fall score was moderate at that time. Patient states he tripped when leaving bathroom. laticia RN found patient on floor.vital signs are BP 73/51 HR 130, o2 sat 77.patient placed on 02 3 lit nasal canula. Patient says no pain and not to call his wife. Patient says " my pride is hurt." staff attempted to put  patient in wheel chair to get  back to bed. Patient fainted with transition. Rapid response called and also notified the NP. Rapid checked the patient.. Patient was alert and oriented. Given NS 1 lit bolus as per NP order and did EKG 12 lead. Vital signs returned BP 150/70, HR 109, 02 sat 100% on 6 lit high flow nasal canula. Continue monitor the patient and follow as per order.

## 2017-10-08 NOTE — Progress Notes (Addendum)
Patient ID: Andre Maldonado, male   DOB: 10-26-1940, 77 y.o.   MRN: [id number]       Subjective: Pt unresponsive on the vent.  Blown left pupil about 30 minutes ago.  Mottled from feet through abdomen.  Objective: Vital signs in last 24 hours: Temp:  [97.1 F (36.2 C)-98.2 F (36.8 C)] 97.1 F (36.2 C) (05/09 0505) Pulse Rate:  [95-130] 110 (05/09 0700) Resp:  [18-36] 20 (05/09 0700) BP: (73-177)/(38-102) 129/70 (05/09 0700) SpO2:  [77 %-100 %] 100 % (05/09 0700) FiO2 (%):  [100 %] 100 % (05/09 0800) Last BM Date: 09/14/17  Intake/Output from previous day: 05/08 0701 - 05/09 0700 In: 1781.3 [P.O.:720; I.V.:961.3; IV Piggyback:100] Out: 2550 [Urine:850; Emesis/NG output:1700] Intake/Output this shift: Total I/O In: 71.3 [I.V.:71.3] Out: 4 [Urine:4]  PE: Gen: unresponsive on vent Heart: mild tachy, regular Lungs: CTAB on vent Abd: skin is mottled. Soft, nondistended Neuro: no corneal reflex.  Left pupil is blown at approx 5-37mm, right is 54mm.  Neither reactive.  No gag per RN when she tries to suction him.  Lab Results:  Recent Labs    09/14/17 0411 09-24-2017 0309  WBC 35.6* 37.3*  HGB 14.9 15.0  HCT 41.7 44.4  PLT 401* 361   BMET Recent Labs    09/14/17 1427 09/24/17 0309  NA 139 140  K 4.8 3.9  CL 94* 97*  CO2 24 16*  GLUCOSE 171* 61*  BUN 21* 26*  CREATININE 1.66* 2.57*  CALCIUM 9.8 9.4   PT/INR No results for input(s): LABPROT, INR in the last 72 hours. CMP     Component Value Date/Time   NA 140 2017/09/24 0309   K 3.9 09/24/17 0309   CL 97 (L) Sep 24, 2017 0309   CO2 16 (L) 09-24-2017 0309   GLUCOSE 61 (L) 2017/09/24 0309   BUN 26 (H) 09-24-17 0309   CREATININE 2.57 (H) 24-Sep-2017 0309   CALCIUM 9.4 24-Sep-2017 0309   PROT 5.8 (L) 09/24/17 0309   ALBUMIN 2.3 (L) 24-Sep-2017 0309   AST 2,202 (H) Sep 24, 2017 0309   ALT 1,547 (H) 2017/09/24 0309   ALKPHOS 404 (H) 09/24/2017 0309   BILITOT 1.6 (H) Sep 24, 2017 0309   GFRNONAA 22 (L)  2017-09-24 0309   GFRAA 26 (L) 2017-09-24 0309   Lipase     Component Value Date/Time   LIPASE 25 10/02/2017 1308       Studies/Results: Ct Head Wo Contrast  Result Date: Sep 24, 2017 CLINICAL DATA:  Initial evaluation for head trauma, fall. EXAM: CT HEAD WITHOUT CONTRAST TECHNIQUE: Contiguous axial images were obtained from the base of the skull through the vertex without intravenous contrast. COMPARISON:  None available. FINDINGS: Brain: Generalized age-related cerebral atrophy. No acute intracranial hemorrhage. No acute large vessel territory infarct. No mass lesion, midline shift or mass effect. No hydrocephalus. No extra-axial fluid collection. Vascular: No hyperdense vessel. Scattered vascular calcifications noted within the carotid siphons. Skull: There is a focal area of hazy soft tissue stranding with overlying skin thickening within the left postauricular region, of uncertain significance, but could conceivably reflect contusion (series 4, image 10). Scalp soft tissues otherwise unremarkable. Calvarium intact. Sinuses/Orbits: Globes and orbital soft tissues within normal limits. Mastoid air cells and paranasal sinuses are clear. Other: None. IMPRESSION: 1. No acute intracranial abnormality. 2. Focal area of soft tissue stranding with overlying skin thickening within the left postauricular region, of uncertain significance or etiology, but could reflect mild contusion. Correlation with physical exam recommended. No calvarial fracture. 3. Intracranial  atherosclerosis. Electronically Signed   By: Jeannine Boga M.D.   On: 10/02/2017 05:14   Dg Chest Port 1 View  Result Date: Oct 02, 2017 CLINICAL DATA:  Ng tube  intubation EXAM: PORTABLE CHEST - 1 VIEW COMPARISON:  Earlier film of the same day FINDINGS: Endotracheal tube is been placed, tip approximately 7 cm above carina. Nasogastric tube loops in the decompressed stomach. Left IJ central venous catheter to the mid SVC. No pneumothorax.  Heart size normal. Some increase in patchy airspace opacities in the lung bases left greater than right. Previous median sternotomy and CABG. Suspect small pleural effusions left greater than right. Cervical fixation hardware partially seen. IMPRESSION: 1. Endotracheal tube, nasogastric tube, and central venous catheter placement as above without apparent complication. No pneumothorax. 2. Worsening bibasilar airspace disease and small effusions, left greater than right Electronically Signed   By: Lucrezia Europe M.D.   On: Oct 02, 2017 07:57   Dg Chest Port 1 View  Result Date: 10-02-2017 CLINICAL DATA:  Acute respiratory distress. Coronary artery disease. Bladder carcinoma. EXAM: PORTABLE CHEST 1 VIEW COMPARISON:  11/15/2011 FINDINGS: Low lung volumes noted. Heart size is within normal limits. Lungs remain clear. Prior CABG again noted. IMPRESSION: Low lung volumes.  No acute findings. Electronically Signed   By: Earle Gell M.D.   On: 02-Oct-2017 07:11   Dg Abd Portable 1v  Result Date: 2017/10/02 CLINICAL DATA:  Ng tube  intubation EXAM: PORTABLE ABDOMEN - 1 VIEW COMPARISON:  Earlier film of the same day FINDINGS: Multiple dilated small bowel loops throughout the abdomen, stable number of involved loops and degree of dilatation since earlier film. The colon appears decompressed. Nasogastric tube has been placed into the decompressed stomach. Surgical clips left upper abdomen. Mild spondylitic changes in the lower lumbar spine. IMPRESSION: 1. Nasogastric tube placement in the stomach with persistent small bowel dilatation Electronically Signed   By: Lucrezia Europe M.D.   On: 10-02-17 07:58   Dg Abd Portable 1v  Result Date: Oct 02, 2017 CLINICAL DATA:  Abdominal pain and distention. EXAM: PORTABLE ABDOMEN - 1 VIEW COMPARISON:  09/14/2017 FINDINGS: Increased moderate dilatation of small bowel is seen within the abdomen. There is also likely gas within the nondependent transverse colon. These findings favor adynamic  ileus over shows small bowel obstruction. IMPRESSION: Findings favor worsening adynamic ileus over small-bowel obstruction, as discussed above. Electronically Signed   By: Earle Gell M.D.   On: 10/02/17 07:10   Dg Abd Portable 1v  Result Date: 09/14/2017 CLINICAL DATA:  Nausea and vomiting EXAM: PORTABLE ABDOMEN - 1 VIEW COMPARISON:  08/19/2017 FINDINGS: Scattered large and small bowel gas is noted. Some mild small bowel dilatation is noted which may represent small bowel ileus or partial small bowel obstruction. Correlation with the physical exam is recommended. No abnormal mass or abnormal calcifications are seen. No free air is noted. Degenerative change of the lumbar spine is noted. IMPRESSION: Mild small bowel dilatation with evidence of air within the colon. These changes may represent a mild ileus or partial small bowel obstruction. Correlation with the physical exam is recommended. Electronically Signed   By: Inez Catalina M.D.   On: 09/14/2017 07:36    Anti-infectives: Anti-infectives (From admission, onward)   Start     Dose/Rate Route Frequency Ordered Stop   09/14/17 0200  piperacillin-tazobactam (ZOSYN) IVPB 3.375 g     3.375 g 12.5 mL/hr over 240 Minutes Intravenous Every 8 hours 10/04/2017 1755     09/14/2017 1800  piperacillin-tazobactam (ZOSYN) IVPB 3.375  g     3.375 g 100 mL/hr over 30 Minutes Intravenous  Once 09/11/2017 1754 09/16/2017 1844       Assessment/Plan  Abdominal pain, nausea, vomiting Leukocytosis Elevated liver function tests Lactic acidosis Hypokalemia Acute kidney injury Acute respiratory failure, combined metabolic and respiratory acidosis  Patient has currently blown his left pupil which is new within the last 30 minutes to 1 hr.  He is on 45mcg of fentanyl only and we held this for further testing.  He has not been stable enough to go down for his CT abd/pel.  At this time, he has minimal neurologic function and CCM is going to scan his head.  His abdominal  complaints may not be an issue if he has no neurologic function.  Will closely follow and see what is found on his head CT to determine what further work up is needed for his abdomen.  Patient prognosis at this time appears poor and not sure yet he needs and or would survive an operation if found to be warranted.    LOS: 2 days    Henreitta Cea , [facility] 03-Oct-2017, 10:11 AM Pager: [phone]

## 2017-10-08 NOTE — Consult Note (Addendum)
Reason for Consult:  acute abdomen Referring Physician:  [place]  JHAMAL PLUCINSKI is an 77 y.o. male.  HPI:  Pt is a 77 yo M with h/o bladder cancer who was admitted 5/7 with worsening epigastric abdominal pain and intractable nausea/vomiting.  The patient has had similar symptoms for the last 2 months associated with a 40 pound weight loss.  He has been scoped as an outpatient by Dr. Therisa Doyne, but records are not immediately available. He reportedly had Barrett's esophagitis and a gastric ulcer.  He has had three abdominal CTs since mid march with the first two showing mild sigmoid diverticulitis, but the last one 4/12 essentially being negative for acute findings.   He had a clinical change last night after he fell.  A head CT was done which was negative for acute intracranial abnormality.  He seemed to perk up with some oxygen, but then decompensated with hypotension and more mental status changes.  He was brought to the MICU (2100) and I am requested to see in STAT fashion.  NGt had been placed by nursing on the floor and 1200 brown fluid returned.  Lactate was elevated at 13 and his WBCs were up to 35.6.  He was not yet intubated and was able to tell me he had worsening abdominal pain.  He said that it was sudden.  He has not had fever, but has had worsening leukocytosis.  He is transferred to ICU. Blood cultures from 5/7 are negative to date.  Past Medical History:  Diagnosis Date  . Arthritis    hands, elbows, knees  . Bladder cancer [facility])    urologist-  [name]  . Coronary arteriosclerosis in native artery    s/p  cabg x1  1981--  Cardiologist-- dr [name]  LOV 02-05-2009  . Hyperlipidemia   . Hypertension   . Hypothyroidism   . Nocturia   . Peripheral vascular disease ([place])    per duplex 03-05-2014  occluded left  SFA  distal level reconstitution at the popliteal  . 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 Wears dentures    upper  . Wears glasses     Past Surgical  History:  Procedure Laterality Date  . ANTERIOR CERVICAL DECOMP/DISCECTOMY FUSION  02/19/2009   C5 -- C6  . CORONARY ARTERY BYPASS GRAFT  1981   in [place] , [place]   x1  LIMA to LAD  . CYSTOSCOPY WITH BIOPSY N/A 06/14/2016   Procedure: CYSTOSCOPY WITH BIOPSY;  Surgeon: [name], MD;  Location: [facility];  Service: Urology;  Laterality: N/A;  . KNEE SURGERY Left 1976  . TRANSURETHRAL RESECTION OF BLADDER TUMOR N/A 04/12/2016   Procedure: TRANSURETHRAL RESECTION OF BLADDER TUMOR (TURBT) WITH EPIRUBICIN  INSTILLATION;  Surgeon: [name], MD;  Location[facility];  Service: Urology;  Laterality: N/A;    No family history on file.  Social History:  reports that he quit smoking about 32 years ago. His smoking use included cigarettes. He quit after 30.00 years of use. He has never used smokeless tobacco. He reports that he drinks alcohol. He reports that he does not use drugs.  Allergies: No Known Allergies  Medications: I have reviewed the patient's current medications.  Results for orders placed or performed during the hospital encounter of 09/24/2017 (from the past 48 hour(s))  Lipase, blood     Status: None   Collection Time: 10/02/2017  1:08 PM  Result Value Ref  Range   Lipase 25 11 - 51 U/L    Comment: Performed at [place] [address], [place], [place] [address]  Comprehensive metabolic panel     Status: Abnormal   Collection Time: 09/14/2017  1:08 PM  Result Value Ref Range   Sodium 135 135 - 145 mmol/L   Potassium 2.2 (LL) 3.5 - 5.1 mmol/L    Comment: CRITICAL RESULT CALLED TO, READ BACK BY AND VERIFIED WITH: Estelle June AT 6948 09/18/2017 BY ZBEECH.    Chloride 92 (L) 101 - 111 mmol/L   CO2 27 22 - 32 mmol/L   Glucose, Bld 176 (H) 65 - 99 mg/dL   BUN 17 6 - 20 mg/dL   Creatinine, Ser 1.36 (H) 0.61 - 1.24 mg/dL   Calcium 8.9 8.9 - 10.3 mg/dL   Total Protein 6.6 6.5 - 8.1 g/dL   Albumin 2.8 (L) 3.5 - 5.0 g/dL   AST  36 15 - 41 U/L   ALT 22 17 - 63 U/L   Alkaline Phosphatase 257 (H) 38 - 126 U/L   Total Bilirubin 0.8 0.3 - 1.2 mg/dL   GFR calc non Af Amer 48 (L) >60 mL/min   GFR calc Af Amer 56 (L) >60 mL/min    Comment: (NOTE) The eGFR has been calculated using the CKD EPI equation. This calculation has not been validated in all clinical situations. eGFR's persistently <60 mL/min signify possible Chronic Kidney Disease.    Anion gap 16 (H) 5 - 15    Comment: Performed at [facility], [place] [address] [place], [place] [address]  CBC with Differential     Status: Abnormal   Collection Time: 09/17/2017  1:08 PM  Result Value Ref Range   WBC 20.5 (H) 4.0 - 10.5 K/uL   RBC 4.49 4.22 - 5.81 MIL/uL   Hemoglobin 14.6 13.0 - 17.0 g/dL   HCT 39.5 39.0 - 52.0 %   MCV 88.0 78.0 - 100.0 fL   MCH 32.5 26.0 - 34.0 pg   MCHC 37.0 (H) 30.0 - 36.0 g/dL   RDW 13.3 11.5 - 15.5 %   Platelets 370 150 - 400 K/uL   Neutrophils Relative % 88 %   Neutro Abs 18.1 (H) 1.7 - 7.7 K/uL   Lymphocytes Relative 8 %   Lymphs Abs 1.6 0.7 - 4.0 K/uL   Monocytes Relative 4 %   Monocytes Absolute 0.8 0.1 - 1.0 K/uL   Eosinophils Relative 0 %   Eosinophils Absolute 0.0 0.0 - 0.7 K/uL   Basophils Relative 0 %   Basophils Absolute 0.0 0.0 - 0.1 K/uL    Comment: Performed at [place] [address], [place], [place] [address]  Magnesium     Status: None   Collection Time: 09/26/2017  1:08 PM  Result Value Ref Range   Magnesium 2.0 1.7 - 2.4 mg/dL    Comment: Performed at [place] [address] [place], [place] [address]  Urinalysis, Routine w reflex microscopic     Status: Abnormal   Collection Time: 09/16/2017  2:44 PM  Result Value Ref Range   Color, Urine AMBER (A) YELLOW    Comment: BIOCHEMICALS MAY BE AFFECTED BY COLOR   APPearance HAZY (A) CLEAR   Specific Gravity, Urine 1.027 1.005 - 1.030   pH 5.0 5.0 - 8.0   Glucose, UA NEGATIVE NEGATIVE mg/dL   Hgb urine dipstick NEGATIVE NEGATIVE    Bilirubin Urine NEGATIVE NEGATIVE  Ketones, ur NEGATIVE NEGATIVE mg/dL   Protein, ur 30 (A) NEGATIVE mg/dL   Nitrite NEGATIVE NEGATIVE   Leukocytes, UA NEGATIVE NEGATIVE   RBC / HPF 0-5 0 - 5 RBC/hpf   WBC, UA 6-10 0 - 5 WBC/hpf   Bacteria, UA RARE (A) NONE SEEN   Squamous Epithelial / LPF 0-5 0 - 5    Comment: Please note change in reference range.   Mucus PRESENT    Hyaline Casts, UA PRESENT    Sperm, UA PRESENT     Comment: Performed at [facility], [place] [address] [place], [place] [address]  Culture, blood (Routine X 2) w Reflex to ID Panel     Status: None (Preliminary result)   Collection Time: 09/30/2017  5:13 PM  Result Value Ref Range   Specimen Description BLOOD RIGHT ANTECUBITAL    Special Requests      BOTTLES DRAWN AEROBIC AND ANAEROBIC Blood Culture adequate volume   Culture      NO GROWTH < 24 HOURS Performed at [facility], [place] [address], [place], [place] [address]    Report Status PENDING   Culture, blood (Routine X 2) w Reflex to ID Panel     Status: None (Preliminary result)   Collection Time: 09/12/2017  5:14 PM  Result Value Ref Range   Specimen Description BLOOD LEFT ANTECUBITAL    Special Requests      BOTTLES DRAWN AEROBIC AND ANAEROBIC Blood Culture adequate volume   Culture      NO GROWTH < 24 HOURS Performed at [facility], [place] [address], [place], [place] [address]    Report Status PENDING   Basic metabolic panel     Status: Abnormal   Collection Time: 10/07/2017 10:00 PM  Result Value Ref Range   Sodium 137 135 - 145 mmol/L   Potassium 2.8 (L) 3.5 - 5.1 mmol/L    Comment: DELTA CHECK NOTED   Chloride 93 (L) 101 - 111 mmol/L   CO2 25 22 - 32 mmol/L   Glucose, Bld 185 (H) 65 - 99 mg/dL   BUN 17 6 - 20 mg/dL   Creatinine, Ser 1.38 (H) 0.61 - 1.24 mg/dL   Calcium 9.2 8.9 - 10.3 mg/dL   GFR calc non Af Amer 47 (L) >60 mL/min   GFR calc Af Amer 55 (L) >60 mL/min    Comment: (NOTE) The eGFR has been calculated using the CKD EPI  equation. This calculation has not been validated in all clinical situations. eGFR's persistently <60 mL/min signify possible Chronic Kidney Disease.    Anion gap 19 (H) 5 - 15    Comment: Performed at [facility], [place] [address], [place],  [address]  Basic metabolic panel     Status: Abnormal   Collection Time: 09/14/17  4:11 AM  Result Value Ref Range   Sodium 137 135 - 145 mmol/L   Potassium 2.4 (LL) 3.5 - 5.1 mmol/L    Comment: CRITICAL RESULT CALLED TO, READ BACK BY AND VERIFIED WITH: Trena Platt [id number] WILDERK    Chloride 93 (L) 101 - 111 mmol/L   CO2 22 22 - 32 mmol/L   Glucose, Bld 198 (H) 65 - 99 mg/dL   BUN 18 6 - 20 mg/dL   Creatinine, Ser 1.53 (H) 0.61 - 1.24 mg/dL   Calcium 9.5 8.9 - 10.3 mg/dL   GFR calc non Af Amer 42 (L) >60 mL/min   GFR calc Af Wyvonnia Lora  48 (L) >60 mL/min    Comment: (NOTE) The eGFR has been calculated using the CKD EPI equation. This calculation has not been validated in all clinical situations. eGFR's persistently <60 mL/min signify possible Chronic Kidney Disease.    Anion gap 22 (H) 5 - 15    Comment: Performed at [facility], [place] [address], [place], [place] [address]  CBC     Status: Abnormal   Collection Time: 09/14/17  4:11 AM  Result Value Ref Range   WBC 35.6 (H) 4.0 - 10.5 K/uL   RBC 4.61 4.22 - 5.81 MIL/uL   Hemoglobin 14.9 13.0 - 17.0 g/dL   HCT 41.7 39.0 - 52.0 %   MCV 90.5 78.0 - 100.0 fL   MCH 32.3 26.0 - 34.0 pg   MCHC 35.7 30.0 - 36.0 g/dL   RDW 14.0 11.5 - 15.5 %   Platelets 401 (H) 150 - 400 K/uL    Comment: Performed at [facility], [place] [address] [place],  [address]  Basic metabolic panel     Status: Abnormal   Collection Time: 09/14/17  2:27 PM  Result Value Ref Range   Sodium 139 135 - 145 mmol/L   Potassium 4.8 3.5 - 5.1 mmol/L    Comment: DELTA CHECK NOTED   Chloride 94 (L) 101 - 111 mmol/L   CO2 24 22 - 32 mmol/L   Glucose, Bld 171 (H) 65 - 99 mg/dL   BUN 21 (H) 6 -  20 mg/dL   Creatinine, Ser 1.66 (H) 0.61 - 1.24 mg/dL   Calcium 9.8 8.9 - 10.3 mg/dL   GFR calc non Af Amer 38 (L) >60 mL/min   GFR calc Af Amer 44 (L) >60 mL/min    Comment: (NOTE) The eGFR has been calculated using the CKD EPI equation. This calculation has not been validated in all clinical situations. eGFR's persistently <60 mL/min signify possible Chronic Kidney Disease.    Anion gap 21 (H) 5 - 15    Comment: Performed at [facility], [place] [address], [place], [place] [address]  Glucose, capillary     Status: Abnormal   Collection Time: 09/14/17 10:39 PM  Result Value Ref Range   Glucose-Capillary 127 (H) 65 - 99 mg/dL  Comprehensive metabolic panel     Status: Abnormal   Collection Time: 10-05-2017  3:09 AM  Result Value Ref Range   Sodium 140 135 - 145 mmol/L   Potassium 3.9 3.5 - 5.1 mmol/L   Chloride 97 (L) 101 - 111 mmol/L   CO2 16 (L) 22 - 32 mmol/L   Glucose, Bld 61 (L) 65 - 99 mg/dL   BUN 26 (H) 6 - 20 mg/dL   Creatinine, Ser 2.57 (H) 0.61 - 1.24 mg/dL   Calcium 9.4 8.9 - 10.3 mg/dL   Total Protein 5.8 (L) 6.5 - 8.1 g/dL   Albumin 2.3 (L) 3.5 - 5.0 g/dL   AST 2,202 (H) 15 - 41 U/L   ALT 1,547 (H) 17 - 63 U/L   Alkaline Phosphatase 404 (H) 38 - 126 U/L   Total Bilirubin 1.6 (H) 0.3 - 1.2 mg/dL   GFR calc non Af Amer 22 (L) >60 mL/min   GFR calc Af Amer 26 (L) >60 mL/min    Comment: (NOTE) The eGFR has been calculated using the CKD EPI equation. This calculation has not been validated in all clinical situations. eGFR's persistently <60 mL/min signify possible Chronic Kidney Disease.    Anion  gap 27 (H) 5 - 15    Comment: Performed at [facility], [place] [address] [place], [place] [address]  CBC with Differential/Platelet     Status: Abnormal   Collection Time: Sep 26, 2017  3:09 AM  Result Value Ref Range   WBC 37.3 (H) 4.0 - 10.5 K/uL    Comment: REPEATED TO VERIFY   RBC 4.64 4.22 - 5.81 MIL/uL   Hemoglobin 15.0 13.0 - 17.0 g/dL   HCT 44.4 39.0 -  52.0 %   MCV 95.7 78.0 - 100.0 fL   MCH 32.3 26.0 - 34.0 pg   MCHC 33.8 30.0 - 36.0 g/dL   RDW 15.0 11.5 - 15.5 %   Platelets 361 150 - 400 K/uL   Neutrophils Relative % 95 %   Lymphocytes Relative 1 %   Monocytes Relative 4 %   Eosinophils Relative 0 %   Basophils Relative 0 %   Band Neutrophils 0 %   Metamyelocytes Relative 0 %   Myelocytes 0 %   Promyelocytes Relative 0 %   Blasts 0 %   nRBC 0 0 /100 WBC   Neutro Abs 35.4 (H) 1.7 - 7.7 K/uL   Lymphs Abs 0.4 (L) 0.7 - 4.0 K/uL   Monocytes Absolute 1.5 (H) 0.1 - 1.0 K/uL   Eosinophils Absolute 0.0 0.0 - 0.7 K/uL   Basophils Absolute 0.0 0.0 - 0.1 K/uL   RBC Morphology RARE NRBCs     Comment: POLYCHROMASIA PRESENT   WBC Morphology INCREASED BANDS (>20% BANDS)     Comment: TOXIC GRANULATION VACUOLATED NEUTROPHILS Performed at [facility], [address], [place], [place] [address]   Magnesium     Status: Abnormal   Collection Time: 09/26/17  3:09 AM  Result Value Ref Range   Magnesium 3.3 (H) 1.7 - 2.4 mg/dL    Comment: Performed at [place] [address], [place], [place] [address]  Lactic acid, plasma     Status: Abnormal   Collection Time: 09-26-17  3:09 AM  Result Value Ref Range   Lactic Acid, Venous 13.4 (HH) 0.5 - 1.9 mmol/L    Comment: RESULTS CONFIRMED BY MANUAL DILUTION CRITICAL RESULT CALLED TO, READ BACK BY AND VERIFIED WITH: MAHAT S,RN 09/26/17 0448 WAYK Performed at [facility], [place] [address] [place], [place] [address]   Glucose, capillary     Status: Abnormal   Collection Time: 09/26/17  5:00 AM  Result Value Ref Range   Glucose-Capillary 31 (LL) 65 - 99 mg/dL  Blood gas, arterial     Status: Abnormal   Collection Time: 09/26/17  5:15 AM  Result Value Ref Range   FIO2 100.00    O2 Content 15.0 L/min   Delivery systems NON-REBREATHER OXYGEN MASK    pH, Arterial 7.022 (LL) 7.350 - 7.450    Comment: CRITICAL RESULT CALLED TO, READ BACK BY AND VERIFIED WITH:  JESSICA SMITH  RRT AT 0526 BY JOSEPH MEEKS RRT ON 26-Sep-2017    pCO2 arterial 51.5 (H) 32.0 - 48.0 mmHg   pO2, Arterial 107 83.0 - 108.0 mmHg   Bicarbonate 12.9 (L) 20.0 - 28.0 mmol/L   Acid-base deficit 16.1 (H) 0.0 - 2.0 mmol/L   O2 Saturation 94.2 %   Patient temperature 97.6    Collection site LEFT RADIAL    Drawn by [id number]    Sample type ARTERIAL DRAW    Allens test (pass/fail) PASS PASS  Glucose, capillary     Status: Abnormal  Collection Time: Sep 21, 2017  5:22 AM  Result Value Ref Range   Glucose-Capillary 144 (H) 65 - 99 mg/dL  Glucose, capillary     Status: None   Collection Time: 2017-09-21  6:28 AM  Result Value Ref Range   Glucose-Capillary 96 65 - 99 mg/dL   Comment 1 Capillary Specimen    Comment 2 Notify RN     Ct Head Wo Contrast  Result Date: Sep 21, 2017 CLINICAL DATA:  Initial evaluation for head trauma, fall. EXAM: CT HEAD WITHOUT CONTRAST TECHNIQUE: Contiguous axial images were obtained from the base of the skull through the vertex without intravenous contrast. COMPARISON:  None available. FINDINGS: Brain: Generalized age-related cerebral atrophy. No acute intracranial hemorrhage. No acute large vessel territory infarct. No mass lesion, midline shift or mass effect. No hydrocephalus. No extra-axial fluid collection. Vascular: No hyperdense vessel. Scattered vascular calcifications noted within the carotid siphons. Skull: There is a focal area of hazy soft tissue stranding with overlying skin thickening within the left postauricular region, of uncertain significance, but could conceivably reflect contusion (series 4, image 10). Scalp soft tissues otherwise unremarkable. Calvarium intact. Sinuses/Orbits: Globes and orbital soft tissues within normal limits. Mastoid air cells and paranasal sinuses are clear. Other: None. IMPRESSION: 1. No acute intracranial abnormality. 2. Focal area of soft tissue stranding with overlying skin thickening within the left postauricular region, of uncertain  significance or etiology, but could reflect mild contusion. Correlation with physical exam recommended. No calvarial fracture. 3. Intracranial atherosclerosis. Electronically Signed   By: Jeannine Boga M.D.   On: 09/21/17 05:14   Dg Abd Portable 1v  Result Date: 09/14/2017 CLINICAL DATA:  Nausea and vomiting EXAM: PORTABLE ABDOMEN - 1 VIEW COMPARISON:  08/19/2017 FINDINGS: Scattered large and small bowel gas is noted. Some mild small bowel dilatation is noted which may represent small bowel ileus or partial small bowel obstruction. Correlation with the physical exam is recommended. No abnormal mass or abnormal calcifications are seen. No free air is noted. Degenerative change of the lumbar spine is noted. IMPRESSION: Mild small bowel dilatation with evidence of air within the colon. These changes may represent a mild ileus or partial small bowel obstruction. Correlation with the physical exam is recommended. Electronically Signed   By: Inez Catalina M.D.   On: 09/14/2017 07:36    Review of Systems  Constitutional: Positive for malaise/fatigue.  HENT: Negative.   Eyes: Negative.   Respiratory: Positive for shortness of breath.   Cardiovascular: Negative.   Gastrointestinal: Positive for abdominal pain, nausea and vomiting.  Genitourinary: Negative.   Musculoskeletal: Negative.   Neurological: Positive for dizziness.  Psychiatric/Behavioral: Negative.      Blood pressure (!) 95/52, pulse (!) 106, temperature (!) 97.1 F (36.2 C), temperature source Rectal, resp. rate (!) 31, weight 86.3 kg (190 lb 4.1 oz), SpO2 94 %. Physical Exam  Constitutional: He is oriented to person, place, and time. He appears well-developed and well-nourished. He appears distressed.  HENT:  Head: Normocephalic and atraumatic.  Right Ear: External ear normal.  Left Ear: External ear normal.  dry mucous membranes  Eyes: Pupils are equal, round, and reactive to light. Conjunctivae are normal. Right eye exhibits  no discharge. Left eye exhibits no discharge. No scleral icterus.  Neck: Neck supple. No tracheal deviation present. No thyromegaly present.  Cardiovascular: Normal heart sounds and intact distal pulses.  tachycardic  Respiratory: Breath sounds normal. He is in respiratory distress. He has no wheezes. He has no rales. He exhibits no tenderness.  GI: Soft. He exhibits distension. He exhibits no mass. There is tenderness (diffuse tenderness, abd soft.  R abd worse than left abdomen). There is guarding. There is no rebound.  Musculoskeletal: He exhibits no edema, tenderness or deformity.  Lymphadenopathy:    He has no cervical adenopathy.  Neurological: He is alert and oriented to person, place, and time. Coordination normal.  Skin: Skin is dry. No rash noted. He is not diaphoretic. No erythema. There is pallor.  mottled     Assessment/Plan: Abdominal pain, nausea, vomiting Leukocytosis Elevated liver function tests Lactic acidosis Hypokalemia Acute kidney injury Acute respiratory failure, combined metabolic and respiratory acidosis  Pt with unclear clinical picture, but clearly has something significant going on in his abdomen.  He has chronic symptoms with acute worsening.   No free air is apparent on plain films. Recommend repeat CT with oral contrast.   Fluid resuscitation, potassium repletion. Pressors for septic shock. Ventilator support. It is also unclear why his transaminases are so high.  He was not very hypotensive for very long, but his transaminases are in the thousands.   Surgery team to follow.    Stark Klein 10-04-17, 6:43 AM

## 2017-10-08 NOTE — Progress Notes (Signed)
CT head without acute abnormality. CTA head and neck without LVO. No venous occlusion seen. Discussed with Radiology.   Given no LVO, hemorrhage or hypodensity seen on CT imaging studies, a non-ischemic brainstem sydrome such as rhombencephalitis should be considered, which require an MRI brain to further evaluate. Radiology agrees.   Per Dr. Nelda Marseille, the patient is not stable enough for MRI as he is maxed out on pressors. I agree with Dr. Nelda Marseille that risk of morbidity/mortality with MRI given his current unstable state outweighs potential benefits. When hemodynamically stable, MRI brain should be obtained STAT.   Although unlikely, a herpesvirus infection of the brainstem is possible. Empiric IV acyclovir is recommended. Given absent brainstem reflexes and blown left pupil, a large brainstem lesion is felt to be most likely and prognosis is felt to be poor.   Discussed with Dr. Nelda Marseille.

## 2017-10-08 NOTE — Progress Notes (Signed)
PULMONARY / CRITICAL CARE MEDICINE   Name: Andre Maldonado MRN: [id number] DOB: 1941/03/11    ADMISSION DATE:  09/14/2017 CONSULTATION DATE:  5/9  REFERRING MD:  Dr, Hal Hope TRH  CHIEF COMPLAINT:  Shock  HISTORY OF PRESENT ILLNESS:   77 year old male with PMH as below, which is significant for bladder Ca, CAD s/p CABG in the 80s, HTN, and hypothyroid. He presented to [name] ED with complaints of ongoing epigastric and periumbilical pain. Had been worked up recently with CT abdomen in April demonstrating the chronic diverticulosis and a small hiatal hernia. Also had EGD done showing Barrett's esophagus, clean base gastric ulcers, and duodenitis. He was prescribed Carafate and omeprazole. He continued to be symptomatic and described significant weight loss. He was admitted for intractable abd pain/nausea/vomiting and treated with antiemetics and IVF with liquid diet. It was felt as though he was improving. Then 5/8 late PM he had a fall in the bathroom. He says he tripped, but sounds like syncope. BP low improved with volume. Continued to decline throughout the night. AM labs with profound acidosis and transaminitis with large WBC elevation. Clinically he was lethargic and hypotensive. PCCM asked to see.   SUBJECTIVE:  Acidosis and AMS overnight, intubated, now unresponsive   VITAL SIGNS: BP 129/70   Pulse (!) 110   Temp (!) 97.1 F (36.2 C) (Rectal)   Resp 20   Ht 5\' 10"  (1.778 m)   Wt 190 lb 4.1 oz (86.3 kg)   SpO2 100%   BMI 27.30 kg/m   HEMODYNAMICS:    VENTILATOR SETTINGS: Vent Mode: PRVC FiO2 (%):  [100 %] 100 % Set Rate:  [20 bmp] 20 bmp Vt Set:  [580 mL] 580 mL PEEP:  [5 cmH20] 5 cmH20 Plateau Pressure:  [19 cmH20] 19 cmH20  INTAKE / OUTPUT: I/O last 3 completed shifts: In: 1831.3 [P.O.:720; I.V.:961.3; IV Piggyback:150] Out: 2550 [Urine:850; Emesis/NG output:1700]  PHYSICAL EXAMINATION: General:  Obese male, unresponsive in bed Neuro:  Unresponsive,  dilated left pupil that is unreactive, no corneal, no gag HEENT:  /AT, PERRL, EOM-I and MMM Cardiovascular:  RRR, Nl S1/S2 and -M/R/G Lungs:  CTA Bilaterally Abdomen:  Soft, NT, ND and +BS Musculoskeletal:  No acute deformity Skin:  Grossly intact  LABS:  BMET Recent Labs  Lab 09/14/17 0411 09/14/17 1427 09/29/17 0309  NA 137 139 140  K 2.4* 4.8 3.9  CL 93* 94* 97*  CO2 22 24 16*  BUN 18 21* 26*  CREATININE 1.53* 1.66* 2.57*  GLUCOSE 198* 171* 61*   Electrolytes Recent Labs  Lab 09/25/2017 1308  09/14/17 0411 09/14/17 1427 Sep 29, 2017 0309  CALCIUM 8.9   < > 9.5 9.8 9.4  MG 2.0  --   --   --  3.3*   < > = values in this interval not displayed.   CBC Recent Labs  Lab 09/27/2017 1308 09/14/17 0411 2017/09/29 0309  WBC 20.5* 35.6* 37.3*  HGB 14.6 14.9 15.0  HCT 39.5 41.7 44.4  PLT 370 401* 361   Coag's No results for input(s): APTT, INR in the last 168 hours.  Sepsis Markers Recent Labs  Lab 09-29-17 0309  LATICACIDVEN 13.4*   ABG Recent Labs  Lab 09-29-17 0515  PHART 7.022*  PCO2ART 51.5*  PO2ART 107   Liver Enzymes Recent Labs  Lab 09/14/2017 1308 09-29-17 0309  AST 36 2,202*  ALT 22 1,547*  ALKPHOS 257* 404*  BILITOT 0.8 1.6*  ALBUMIN 2.8* 2.3*   Cardiac Enzymes  No results for input(s): TROPONINI, PROBNP in the last 168 hours.  Glucose Recent Labs  Lab 09/14/17 2239 10/14/2017 0500 October 14, 2017 0522 14-Oct-2017 0628 2017-10-14 0739  GLUCAP 127* 31* 144* 96 77   Imaging Ct Head Wo Contrast  Result Date: October 14, 2017 CLINICAL DATA:  Initial evaluation for head trauma, fall. EXAM: CT HEAD WITHOUT CONTRAST TECHNIQUE: Contiguous axial images were obtained from the base of the skull through the vertex without intravenous contrast. COMPARISON:  None available. FINDINGS: Brain: Generalized age-related cerebral atrophy. No acute intracranial hemorrhage. No acute large vessel territory infarct. No mass lesion, midline shift or mass effect. No hydrocephalus. No  extra-axial fluid collection. Vascular: No hyperdense vessel. Scattered vascular calcifications noted within the carotid siphons. Skull: There is a focal area of hazy soft tissue stranding with overlying skin thickening within the left postauricular region, of uncertain significance, but could conceivably reflect contusion (series 4, image 10). Scalp soft tissues otherwise unremarkable. Calvarium intact. Sinuses/Orbits: Globes and orbital soft tissues within normal limits. Mastoid air cells and paranasal sinuses are clear. Other: None. IMPRESSION: 1. No acute intracranial abnormality. 2. Focal area of soft tissue stranding with overlying skin thickening within the left postauricular region, of uncertain significance or etiology, but could reflect mild contusion. Correlation with physical exam recommended. No calvarial fracture. 3. Intracranial atherosclerosis. Electronically Signed   By: Jeannine Boga M.D.   On: 14-Oct-2017 05:14   Dg Chest Port 1 View  Result Date: Oct 14, 2017 CLINICAL DATA:  Ng tube  intubation EXAM: PORTABLE CHEST - 1 VIEW COMPARISON:  Earlier film of the same day FINDINGS: Endotracheal tube is been placed, tip approximately 7 cm above carina. Nasogastric tube loops in the decompressed stomach. Left IJ central venous catheter to the mid SVC. No pneumothorax. Heart size normal. Some increase in patchy airspace opacities in the lung bases left greater than right. Previous median sternotomy and CABG. Suspect small pleural effusions left greater than right. Cervical fixation hardware partially seen. IMPRESSION: 1. Endotracheal tube, nasogastric tube, and central venous catheter placement as above without apparent complication. No pneumothorax. 2. Worsening bibasilar airspace disease and small effusions, left greater than right Electronically Signed   By: Lucrezia Europe M.D.   On: 10-14-17 07:57   Dg Chest Port 1 View  Result Date: October 14, 2017 CLINICAL DATA:  Acute respiratory distress.  Coronary artery disease. Bladder carcinoma. EXAM: PORTABLE CHEST 1 VIEW COMPARISON:  11/15/2011 FINDINGS: Low lung volumes noted. Heart size is within normal limits. Lungs remain clear. Prior CABG again noted. IMPRESSION: Low lung volumes.  No acute findings. Electronically Signed   By: Earle Gell M.D.   On: October 14, 2017 07:11   Dg Abd Portable 1v  Result Date: 2017-10-14 CLINICAL DATA:  Ng tube  intubation EXAM: PORTABLE ABDOMEN - 1 VIEW COMPARISON:  Earlier film of the same day FINDINGS: Multiple dilated small bowel loops throughout the abdomen, stable number of involved loops and degree of dilatation since earlier film. The colon appears decompressed. Nasogastric tube has been placed into the decompressed stomach. Surgical clips left upper abdomen. Mild spondylitic changes in the lower lumbar spine. IMPRESSION: 1. Nasogastric tube placement in the stomach with persistent small bowel dilatation Electronically Signed   By: Lucrezia Europe M.D.   On: 14-Oct-2017 07:58   Dg Abd Portable 1v  Result Date: 10/14/2017 CLINICAL DATA:  Abdominal pain and distention. EXAM: PORTABLE ABDOMEN - 1 VIEW COMPARISON:  09/14/2017 FINDINGS: Increased moderate dilatation of small bowel is seen within the abdomen. There is also likely gas within  the nondependent transverse colon. These findings favor adynamic ileus over shows small bowel obstruction. IMPRESSION: Findings favor worsening adynamic ileus over small-bowel obstruction, as discussed above. Electronically Signed   By: Earle Gell M.D.   On: 10/09/17 07:10   STUDIES:  CT abd 4/12 > Colonic diverticulosis, without radiographic evidence of diverticulitis or other acute findings. Stable small hiatal hernia. Stable mildly enlarged prostate. EGD 4/11 > Barrett's esophagus, clean base gastric ulcers, and duodenitis. CT abdomen 5/9 >>>  CULTURES: Blood 5/7 > GI panel PCR 5/9 > C. Dif >  ANTIBIOTICS: Zosyn 5/7 >  SIGNIFICANT EVENTS: 5/7 admit 5/9 ICU  transfer  LINES/TUBES: ETT 5/9 > CVL 5/9 >  DISCUSSION: 77 yr old male with PMHx bladder CA s/p TURP,CAD,CABG, HTN, Hypothyroid, Barrett's, gastric ulcers, and duodenitis on last EGD, On this admission presented on 09/20/2017 with abdominal pain.  During his admission he had a syncopal episode and continued to clinically decline  Eventually becoming hypotensive with Anion Gap metabolic acidosis, transaminitis and worsening abdominal pain concerning for an acute abdomen  ASSESSMENT / PLAN:  PULMONARY  A: Acute hypoxic and hypercarbic respiratory failure 7.022/51/107/12 P:   Intubated on mechanical ventilation PRVC TV 8cc/kg Get ABG in 1 hr post intubation VAP Daily wean per protocol  CARDIOVASCULAR A:  Septic Shock secondary to peritonitis P:  MAP goal >3mmHg Levophed for BP support Continue IVF resuscitation given high output from NGT Will need ECHO to assess LVEF in acute setting of clinical decline if neuro status is stable  RENAL A:   Anion Gap Metabolic Acidosis secondary to Lactic acidosis Acute Kidney Injury P:   Avoid nephrotoxic meds Concern for necrotic bowel with lactic acidosis Trend Lactate NS as ordered BMET in AM Replace electrolytes as indicated  GASTROINTESTINAL A:   Acute Abdomen- h/o diverticulosis- ? Perforation vs obstruction Transaminitis- almost shock liver and taking 1000 tylenols tablets over the past month per wife P:   LFT elevated, check tylenol level Earlier XR was concerning for ileus versus partial SBO. Maintain NPO and no TF NGT placed and on cont suction Abdominal/Pelvic CT with  Appreciate Surgery seeing pt emergently and will f/u recs PPI  HEMATOLOGIC A:   No active issues P:  If Hgb <7 transfuse PRBCs No h/o coagulopathy SCDs ok for VTE ppx  Hold anti-coagulation  INFECTIOUS A:   Diverticulosis Peritonitis? P:   Gram neg anaerobic coverage with Zosyn IV Trend WBC, fever curve and Lactate CT of the  abdomen Surgery and GI following  ENDOCRINE A:   H/o Hypothyroid No h/o DM P:   Check cortisol level, pending Stress dose steroids If CBG exceeds 180mg /dl pt will need ISS for glycemic control Check TSH  NEUROLOGIC A:   Acute metabolic encephalopathy H/o Syncopal episode Concern for ICH or SDH P:   RASS goal: -1 Started on cont fentanyl Intermittent versed STAT head CT EEG Neurology consult D/C all anticoagulation Check coags  FAMILY  - Updates: Updated wife bedside, informed that we are very concerned about neuro status.  Discussed with neurology and GI.  - Inter-disciplinary family meet or Palliative Care meeting due by: 7th day -(May 16th)  The patient is critically ill with multiple organ systems failure and requires high complexity decision making for assessment and support, frequent evaluation and titration of therapies, application of advanced monitoring technologies and extensive interpretation of multiple databases.   Critical Care Time devoted to patient care services described in this note is  60  Minutes. This time reflects  time of care of this signee Dr [name]. This critical care time does not reflect procedure time, or teaching time or supervisory time of PA/NP/Med student/Med Resident etc but could involve care discussion time.  Rush Farmer, M.D. [facility] Pulmonary/Critical Care Medicine. Pager: [phone]. After hours pager: [phone].  10-06-17, 10:09 AM

## 2017-10-08 NOTE — Procedures (Signed)
Central Venous Catheter Insertion Procedure Note TEODOR PRATER [id number] Apr 09, 1941  Procedure: Insertion of Central Venous Catheter Indications: Assessment of intravascular volume  Procedure Details Consent: Unable to obtain consent because of emergent medical necessity. Time Out: Verified patient identification, verified procedure, site/side was marked, verified correct patient position, special equipment/implants available, medications/allergies/relevent history reviewed, required imaging and test results available.  Performed  Maximum sterile technique was used including antiseptics, cap, gloves, gown, hand hygiene, mask and sheet. Skin prep: Chlorhexidine; local anesthetic administered A antimicrobial bonded/coated triple lumen catheter was placed in the left internal jugular vein using the Seldinger technique. Ultrasound guidance used.Yes.   Catheter placed to 20 cm. Blood aspirated via all 3 ports and then flushed x 3. Line sutured x 2 and dressing applied.  Evaluation Blood flow good Complications: No apparent complications Patient did tolerate procedure well. Chest X-ray ordered to verify placement.  CXR: pending.  Georgann Housekeeper, AGACNP-BC [facility] Pulmonology/Critical Care Pager [phone] or [phone]  2017-09-27 7:15 AM

## 2017-10-08 NOTE — Consult Note (Addendum)
PULMONARY / CRITICAL CARE MEDICINE   Name: Andre Maldonado MRN: [id number] DOB: [birth year]    ADMISSION DATE:  09/20/2017 CONSULTATION DATE:  5/9  REFERRING MD:  Dr, Hal Hope TRH  CHIEF COMPLAINT:  Shock  HISTORY OF PRESENT ILLNESS:   77 year old male with PMH as below, which is significant for bladder Ca, CAD s/p CABG in the 80s, HTN, and hypothyroid. He presented to [facility] ED with complaints of ongoing epigastric and periumbilical pain. Had been worked up recently with CT abdomen in April demonstrating the chronic diverticulosis and a small hiatal hernia. Also had EGD done showing Barrett's esophagus, clean base gastric ulcers, and duodenitis. He was prescribed Carafate and omeprazole. He continued to be symptomatic and described significant weight loss. He was admitted for intractable abd pain/nausea/vomiting and treated with antiemetics and IVF with liquid diet. It was felt as though he was improving. Then 5/8 late PM he had a fall in the bathroom. He says he tripped, but sounds like syncope. BP low improved with volume. Continued to decline throughout the night. AM labs with profound acidosis and transaminitis with large WBC elevation. Clinically he was lethargic and hypotensive. PCCM asked to see.   PAST MEDICAL HISTORY :  He  has a past medical history of Arthritis, Bladder cancer ([place]), Coronary arteriosclerosis in native artery, Hyperlipidemia, Hypertension, Hypothyroidism, Nocturia, Peripheral vascular disease ([place]), S/P CABG x 1, Wears dentures, and Wears glasses.  PAST SURGICAL HISTORY: He  has a past surgical history that includes Knee surgery (Left, 1976); Anterior cervical decomp/discectomy fusion (02/19/2009); Coronary artery bypass graft (1981   in [place] , [place]); Transurethral resection of bladder tumor (N/A, 04/12/2016); and Cystoscopy with biopsy (N/A, 06/14/2016).  No Known Allergies  No current facility-administered medications on file prior to encounter.    Current  Outpatient Medications on File Prior to Encounter  Medication Sig  . acetaminophen (TYLENOL) 325 MG tablet Take 650 mg by mouth every 6 (six) hours as needed for mild pain.  Marland Kitchen alum & mag hydroxide-simeth (MAALOX/MYLANTA) 200-200-20 MG/5ML suspension Take 15 mLs by mouth every 6 (six) hours as needed for indigestion or heartburn.  . Ca Carbonate-Mag Hydroxide (ROLAIDS PO) Take 1 tablet by mouth daily as needed (indigestion).  . calcium carbonate (TUMS - DOSED IN MG ELEMENTAL CALCIUM) 500 MG chewable tablet Chew 1 tablet by mouth daily as needed for indigestion or heartburn.  . levothyroxine (SYNTHROID, LEVOTHROID) 100 MCG tablet Take 100 mcg by mouth daily before breakfast.  . losartan-hydrochlorothiazide (HYZAAR) 100-12.5 MG tablet Take 1 tablet by mouth every morning.  . metoprolol (LOPRESSOR) 50 MG tablet Take 25 mg by mouth 2 (two) times daily.   . simvastatin (ZOCOR) 20 MG tablet Take 20 mg by mouth every morning.     FAMILY HISTORY:  His has no family status information on file.    SOCIAL HISTORY: He  reports that he quit smoking about 32 years ago. His smoking use included cigarettes. He quit after 30.00 years of use. He has never used smokeless tobacco. He reports that he drinks alcohol. He reports that he does not use drugs.  REVIEW OF SYSTEMS:   Unable as patient is encephaloapthic  SUBJECTIVE:    VITAL SIGNS: BP (!) 95/52   Pulse (!) 106   Temp (!) 97.1 F (36.2 C) (Rectal)   Resp (!) 31   Wt 86.3 kg (190 lb 4.1 oz)   SpO2 94%   BMI 27.30 kg/m   HEMODYNAMICS:    VENTILATOR SETTINGS:  INTAKE / OUTPUT: I/O last 3 completed shifts: In: 4098 [P.O.:720; I.V.:900; IV Piggyback:200] Out: 850 [Urine:850]  PHYSICAL EXAMINATION: General:  Overweight male in distress Neuro:  Lethargic, barely aware of surroundings.  HEENT:  [place]/AT, PERRL, no JVD Cardiovascular:  RRR, no MRG Lungs:  Clear Abdomen:  Firm, tender Musculoskeletal:  No acute deformity or ROM  limitation Skin:  Grossly intact  LABS:  BMET Recent Labs  Lab 09/14/17 0411 09/14/17 1427 09/23/17 0309  NA 137 139 140  K 2.4* 4.8 3.9  CL 93* 94* 97*  CO2 22 24 16*  BUN 18 21* 26*  CREATININE 1.53* 1.66* 2.57*  GLUCOSE 198* 171* 61*    Electrolytes Recent Labs  Lab 09/25/2017 1308  09/14/17 0411 09/14/17 1427 09/23/17 0309  CALCIUM 8.9   < > 9.5 9.8 9.4  MG 2.0  --   --   --  3.3*   < > = values in this interval not displayed.    CBC Recent Labs  Lab 09/14/2017 1308 09/14/17 0411 09/23/2017 0309  WBC 20.5* 35.6* 37.3*  HGB 14.6 14.9 15.0  HCT 39.5 41.7 44.4  PLT 370 401* 361    Coag's No results for input(s): APTT, INR in the last 168 hours.  Sepsis Markers Recent Labs  Lab 09/23/2017 0309  LATICACIDVEN 13.4*    ABG Recent Labs  Lab 09/23/2017 0515  PHART 7.022*  PCO2ART 51.5*  PO2ART 107    Liver Enzymes Recent Labs  Lab 09/16/2017 1308 Sep 23, 2017 0309  AST 36 2,202*  ALT 22 1,547*  ALKPHOS 257* 404*  BILITOT 0.8 1.6*  ALBUMIN 2.8* 2.3*    Cardiac Enzymes No results for input(s): TROPONINI, PROBNP in the last 168 hours.  Glucose Recent Labs  Lab 09/14/17 2239 09-23-17 0500 Sep 23, 2017 0522  GLUCAP 127* 31* 144*    Imaging Ct Head Wo Contrast  Result Date: 09/23/2017 CLINICAL DATA:  Initial evaluation for head trauma, fall. EXAM: CT HEAD WITHOUT CONTRAST TECHNIQUE: Contiguous axial images were obtained from the base of the skull through the vertex without intravenous contrast. COMPARISON:  None available. FINDINGS: Brain: Generalized age-related cerebral atrophy. No acute intracranial hemorrhage. No acute large vessel territory infarct. No mass lesion, midline shift or mass effect. No hydrocephalus. No extra-axial fluid collection. Vascular: No hyperdense vessel. Scattered vascular calcifications noted within the carotid siphons. Skull: There is a focal area of hazy soft tissue stranding with overlying skin thickening within the left  postauricular region, of uncertain significance, but could conceivably reflect contusion (series 4, image 10). Scalp soft tissues otherwise unremarkable. Calvarium intact. Sinuses/Orbits: Globes and orbital soft tissues within normal limits. Mastoid air cells and paranasal sinuses are clear. Other: None. IMPRESSION: 1. No acute intracranial abnormality. 2. Focal area of soft tissue stranding with overlying skin thickening within the left postauricular region, of uncertain significance or etiology, but could reflect mild contusion. Correlation with physical exam recommended. No calvarial fracture. 3. Intracranial atherosclerosis. Electronically Signed   By: Jeannine Boga M.D.   On: 09/23/17 05:14     STUDIES:  CT abd 4/12 > Colonic diverticulosis, without radiographic evidence of diverticulitis or other acute findings. Stable small hiatal hernia. Stable mildly enlarged prostate. EGD 4/11 > Barrett's esophagus, clean base gastric ulcers, and duodenitis. CT abdomen 5/9 >>>   CULTURES: Blood 5/7 > GI panel PCR 5/9 > C. Dif >  ANTIBIOTICS: Zosyn 5/7 >  SIGNIFICANT EVENTS: 5/7 admit 5/9 ICU transfer  LINES/TUBES: ETT 5/9 > CVL 5/9 >  DISCUSSION: 77 yr  old male with PMHx bladder CA s/p TURP,CAD,CABG, HTN, Hypothyroid, Barrett's, gastric ulcers, and duodenitis on last EGD, On this admission presented on 09/19/2017 with abdominal pain.  During his admission he had a syncopal episode and continued to clinically decline  Eventually becoming hypotensive with Anion Gap metabolic acidosis, transaminitis and worsening abdominal pain concerning for an acute abdomen  ASSESSMENT / PLAN:  PULMONARY  A: Acute hypoxic and hypercarbic respiratory failure 7.022/51/107/12 P:   Intubated on mechanical ventilation PRVC TV 8cc/kg Get ABG in 1 hr post intubation VAP Daily wean per protocol   CARDIOVASCULAR A:  Septic Shock secondary to peritonitis P:  MAP goal >56mmHg Started on Levophed  ggt Continue IVF resuscitation given high output from NGT Will need ECHO to assess LVEF in acute setting of clinical decline  RENAL A:   Anion Gap Metabolic Acidosis secondary to Lactic acidosis Acute Kidney Injury P:   Avoid nephrotoxic meds Most likely injury secondary to hypoperfusion Trend Lactate Most likely secondary to possible compromise of bowel Also increased secondary to resp compromise Continue IVF S/p 2 amps of bicarb    GASTROINTESTINAL A:   Acute Abdomen- h/o diverticulosis- ? Perforation vs obstruction Transaminitis- almost shock liver P:   Unclear as to why LFTs are so elevated pt only had a brief episode of hypotension However given pt's h/o HTN current BP may be significant hypotension. Earlier XR was concerning for ileus versus partial SBO. Worsening Abdominal pain Keep pt NPO NGT placed and on cont suction Output 1700cc bilious fluid <1hr CT Abd/pelvis stat w/o IV contrast given renal fn and 1/2 dose oral contrast Appreciate Surgery seeing pt emergently and will f/u recs PPI  HEMATOLOGIC A:   No active issues P:  If Hgb <7 transfuse PRBCs No h/o coagulopathy SCDs ok for VTE ppx  hold AC in case pt needs surgical intervention  INFECTIOUS A:   Diverticulosis Peritonitis? P:   Gram neg anaerobic coverage with Zosyn IV Noted significant leukocytosis Lactic acidosis 13 Trend WBC, fever curve and Lactate  ENDOCRINE A:   H/o Hypothyroid No h/o DM  P:   Check cortisol level May need stress dose steroids If CBG exceeds 180mg /dl pt will need ISS for glycemic control Check TSH   NEUROLOGIC A:   Acute metabolic encephalopathy H/o Syncopal episode P:   RASS goal: -1 Started on cont fentanyl Intermittent versed CTH was negative for any acute pathology post fall   FAMILY  - Updates: none at bedside at time of eval.   - Inter-disciplinary family meet or Palliative Care meeting due by: 7th day -(May 16th)   Kandice Hams  MD Pulmonary and Critical Care Medicine  10-08-2017, 6:24 AM

## 2017-10-08 NOTE — Progress Notes (Signed)
Patient has low BP 72/39, hr- 97, 02 sat- 89 RES- 30 and lactic acid was 13.4. Patient was alert and oriented self. Call the rapid response and notified the on call  NP bodenheimer. After few minutes Dr Oneta Rack came to see patient in bed side. Patient has done ABG,chest x-ray and abd x-ray. Given 1 lit NS bolus. Given d50 iv for cbg 31, bicarbonate 50 meq iv as per order. Then patient transfer to 72M. Patient's wife try to call about him but could not get the answerer.

## 2017-10-08 NOTE — Progress Notes (Addendum)
Patient expired. RT extubated per previous terminal order so that wife could see him. I spoke with Dr. Nelda Marseille to clarify that this was ok prior to pulling ETT even though there was an order, MD said ok to pull ETT. Vent left in room until family moves.

## 2017-10-08 NOTE — Significant Event (Signed)
Rapid Response Event Note  Overview: Time Called: 0454 Arrival Time: 0456 Event Type: Respiratory, Cardiac, Hypotension  Initial Focused Assessment: 78 yo caucasian male lying in the bed on a nonrebreather mask.  Pt is arousable to voice and now oriented to self only.  Significant change in neuro status since approximately 2230.  Skin very pale, mottled thoroughout and cool to the touch.  Pt now c/o abdominal pain.  BBS clear but diminished in the bases.  HR 114 reg, bp 61/45, RR 25 labored, and sats 85% on 100% NRB.  C. Bodenheimer notified and Dr. Nichola Sizer notified for a bedside provider.  Recent lactate 13.  Dr. Nichola Sizer came to the bedside and consulted PCCM.   Interventions: -NS bolus x 2 liters -ABG -PCXR and KUB -2nd PIV - 1 amp of NaHCO3 given per order -Transfer to 2M05    Madelynn Done

## 2017-10-08 NOTE — Procedures (Signed)
Arterial Catheter Insertion Procedure Note Andre Maldonado [id number] Oct 28, 1940  Procedure: Insertion of Arterial Catheter  Indications: Blood pressure monitoring and Frequent blood sampling  Procedure Details Consent: Unable to obtain consent because of emergent medical necessity. Time Out: Verified patient identification, verified procedure, site/side was marked, verified correct patient position, special equipment/implants available, medications/allergies/relevent history reviewed, required imaging and test results available.  Performed  Maximum sterile technique was used including antiseptics, cap, gloves, gown, hand hygiene, mask and sheet. Skin prep: Chlorhexidine; local anesthetic administered 20 gauge catheter was inserted into right femoral artery using the Seldinger technique. ULTRASOUND GUIDANCE USED: NO Evaluation Blood flow good; BP tracing good. Complications: No apparent complications.   Andre Maldonado 2017/09/28

## 2017-10-08 NOTE — Progress Notes (Signed)
200cc Fentanyl gtt and 90cc Morphine gtt wasted down sink. Witnessed by Meade Maw, RN.

## 2017-10-08 NOTE — Death Summary Note (Signed)
DEATH SUMMARY   Patient Details  Name: Andre Maldonado MRN: [id number] DOB: Oct 06, 1940  Admission/Discharge Information   Admit Date:  10-01-2017  Date of Death: Date of Death: Oct 03, 2017  Time of Death: Time of Death: 71  Length of Stay: 2  Referring Physician: Lavone Orn, MD   Reason(s) for Hospitalization  Septic shock  Diagnoses  Preliminary cause of death:   Refractory shock Secondary Diagnoses (including complications and co-morbidities):  Principal Problem:   Epigastric pain Active Problems:   Hypothyroidism   Hyperlipidemia   Essential hypertension   CAD (coronary artery disease)   History of bladder cancer   Nausea vomiting and diarrhea   History of Barrett's esophagus   Abdominal pain   Shock ([place])   Acute respiratory distress   Septic shock ([place])   Fall   Palliative care encounter   Goals of care, counseling/discussion   Brief Hospital Course (including significant findings, care, treatment, and services provided and events leading to death)  77 year old male with PMH as below, which is significant for bladder Ca, CAD s/p CABG in the 80s, HTN, and hypothyroid. He presented to [facility] ED with complaints of ongoing epigastric and periumbilical pain. Had been worked up recently with CT abdomen in April demonstrating the chronic diverticulosis and a small hiatal hernia. Also had EGD done showing Barrett's esophagus, clean base gastric ulcers, and duodenitis. He was prescribed Carafate and omeprazole. He continued to be symptomatic and described significant weight loss. He was admitted for intractable abd pain/nausea/vomiting and treated with antiemetics and IVF with liquid diet. It was felt as though he was improving. Then 5/8 late PM he had a fall in the bathroom. He says he tripped, but sounds like syncope. BP low improved with volume. Continued to decline throughout the night. AM labs with profound acidosis and transaminitis with large WBC elevation.  Clinically he was lethargic and hypotensive. PCCM asked to see.   Went down with patient to CT since he was so unstable.  CT with contrast was negative for bleed.  CT with contrast showed no CVA.  Spoke with neurology MD multiple times, feeling is this could be an encephalitis.  CT of the abdomen showed some pneumoatosis.  Patient is profoundly acidotic and hypotensive likely from abdominal source.  The neuro exam is still consistent with CVA and neurology would like an MRI but patient is not stable enough for a long trip as I had to push epi multiple times during the CT.  MRI is not an option at this time.    Spoke with wife, patient has clearly told her multiple time he would not want aggressive care.    After discussion decision was made to make patient a full DNR and proceed with comfort care.  Will place orders.    Pertinent Labs and Studies  Significant Diagnostic Studies Ct Abdomen Pelvis Wo Contrast  Addendum Date: 10/03/17   ADDENDUM REPORT: 10-03-2017 13:21 ADDENDUM: These results were called by telephone at the time of interpretation on 03-Oct-2017 at 1:21 pm to Dr. Nelda Marseille, who verbally acknowledged these results. Electronically Signed   By: Marijo Conception, M.D.   On: 2017-10-03 13:21   Result Date: 2017/10/03 CLINICAL DATA:  Abdominal distension and pain. EXAM: CT ABDOMEN AND PELVIS WITHOUT CONTRAST TECHNIQUE: Multidetector CT imaging of the abdomen and pelvis was performed following the standard protocol without IV contrast. COMPARISON:  CT scan of August 19, 2017. FINDINGS: Lower chest: Bilateral posterior basilar airspace opacities are noted  concerning for pneumonia or possibly atelectasis. Hepatobiliary: No focal liver abnormality is seen. No gallstones, gallbladder wall thickening, or biliary dilatation. Pancreas: Unremarkable. No pancreatic ductal dilatation or surrounding inflammatory changes. Spleen: Normal in size without focal abnormality. Adrenals/Urinary Tract: Adrenal  glands appear normal. Left kidney and ureter appear normal. Mildly atrophic right kidney is noted. Large right parapelvic cyst is noted. No hydronephrosis or renal obstruction is noted. Urinary bladder is decompressed secondary to Foley catheter. Stomach/Bowel: Nasogastric tube is in stomach. The appendix and stomach appear normal. Diverticulosis of descending and sigmoid colon is noted without inflammation. There is interval development of dilated small bowel loops with what appears to be intramural gas as well as gas within mesenteric vascular consistent with ischemic bowel. Vascular/Lymphatic: Aortic atherosclerosis. No enlarged abdominal or pelvic lymph nodes. Reproductive: Stable mild prostatic enlargement is noted. Other: No abdominal wall hernia or abnormality. No abdominopelvic ascites. Musculoskeletal: No acute or significant osseous findings. IMPRESSION: Interval development of dilated small bowel loops with intramural gas as well as gas within mesenteric vasculature highly concerning for ischemic bowel. Critical Value/emergent results were called by telephone at the time of interpretation on 10-15-17 at 1:16 pm to Ines Bloomer, RN, the patient's nurse, who verbally acknowledged these results. She stated they were already aware that he had ischemic bowel clinically, and were discussing with family about further options. Seizures she will discuss with the physician immediately. Moderate bilateral posterior basilar airspace opacities are noted concerning for pneumonia or less likely atelectasis. Diverticulosis of descending and sigmoid colon is noted without inflammation. Stable mild prostatic enlargement. Aortic Atherosclerosis (ICD10-I70.0). Electronically Signed: By: Marijo Conception, M.D. On: 10-15-17 13:17   Ct Angio Head W Or Wo Contrast  Result Date: 2017/10/15 CLINICAL DATA:  77 year old inpatient found obtunded, blown pupil. Abnormal brainstem reflexes. EXAM: CT ANGIOGRAPHY HEAD AND NECK  TECHNIQUE: Multidetector CT imaging of the head and neck was performed using the standard protocol during bolus administration of intravenous contrast. Multiplanar CT image reconstructions and MIPs were obtained to evaluate the vascular anatomy. Carotid stenosis measurements (when applicable) are obtained utilizing NASCET criteria, using the distal internal carotid diameter as the denominator. CONTRAST:  160mL ISOVUE-370 IOPAMIDOL (ISOVUE-370) INJECTION 76% COMPARISON:  Head CT without contrast 1125 hours today. FINDINGS: CTA NECK Skeleton: The prior sternotomy. Prior C5-C6 ACDF. No acute osseous abnormality identified. Upper chest: Intubated. Endotracheal tube tip just below the thoracic inlet. Enteric tube within the visible esophagus. Mild dependent atelectasis in the upper lungs. No superior mediastinal lymphadenopathy. Other neck: left IJ approach central line partially visible. Small volume fluid layering in the pharynx in the setting of intubation. Negative parapharyngeal and retropharyngeal spaces. Negative sublingual, submandibular and parotid spaces. No neck mass or lymphadenopathy identified. Aortic arch: 3 vessel arch configuration. Mild arch and great vessel origin atherosclerosis. Right carotid system: No brachiocephalic artery or right CCA origin stenosis. Negative right CCA to the bifurcation. Mild to moderate calcified plaque at the right carotid bifurcation and ICA origin without stenosis. There is some soft plaque at the posterior right ICA distal bulb, again without significant stenosis. Tortuous right ICA just below the skull base. Left carotid system: No left CCA origin stenosis despite atherosclerosis. Otherwise negative left CCA proximal to the bifurcation. Soft and calcified plaque at the left carotid bifurcation and left ICA origin continuing into the bulb. However, stenosis is less than 50 % with respect to the distal vessel. Tortuous cervical left ICA just below the skull base. Vertebral  arteries: No proximal right subclavian  artery stenosis. The race calcified plaque at the right vertebral artery origin without stenosis. The right vertebral artery is patent to the skull base with mild irregularity but no stenosis. No proximal left subclavian artery stenosis. Normal left vertebral artery origin. Tortuous left V1 segment. The left vertebral artery is patent to the skull base with mild irregularity but no stenosis. CTA HEAD Posterior circulation: Codominant somewhat diminutive distal vertebral arteries are patent to the vertebrobasilar junction without stenosis. Both PICA origins are patent. Normal vertebrobasilar junction. Patent basilar artery, somewhat diminutive. No basilar stenosis. Patent SCA and left PCA origins. Fetal type right PCA origin. Left posterior communicating artery is diminutive or absent. Bilateral PCA branches are patent and within normal limits. Anterior circulation: Both ICA siphons are patent. There is moderate right and mild left ICA siphon calcified plaque. Only mild associated siphon stenosis on the right. Normal ophthalmic and right posterior communicating artery origins. Patent carotid termini. Normal MCA and ACA origins. Anterior communicating artery and bilateral ACA branches are within normal limits. The right A2 is mildly dominant. The left MCA M1, bifurcation, and left MCA branches are patent and within normal limits. The right MCA M1, bifurcation, and right MCA branches are patent and within normal limits. Venous sinuses: Good intracranial venous contrast also on the arterial phase images. The superior sagittal sinus, torcula, straight sinus, vein of Galen, internal cerebral veins, and basal veins of Rosenthal are patent. Both transverse sinuses, sigmoid sinuses, and IJ bulbs are patent. Anatomic variants: Mildly dominant right ACA A2 segment. Delayed phase: The wedge-shaped hypodensity described in the central medulla at 1125 hours today is less apparent following  contrast (series 14, image 1), perhaps due to luxury perfusion. Otherwise stable gray-white matter differentiation. No abnormal intracranial enhancement. Review of the MIP images confirms the above findings IMPRESSION: 1. Negative for large vessel occlusion. Negative for dural venous sinus thrombosis. 2. Head and neck atherosclerosis, but no significant arterial stenosis identified. No circle of Willis branch occlusion identified. The vertebrobasilar system does appear somewhat diminutive. 3. Continued suspicion of abnormal wedge-shaped hypodensity in the central medulla. Recommend follow-up Brain MRI. 4. Preliminary results of the above were discussed by telephone with Dr. Bruce Donath on 09-19-17 at 1208 hours. Electronically Signed   By: Genevie Ann M.D.   On: Sep 19, 2017 12:32   Ct Head Wo Contrast  Result Date: Sep 19, 2017 CLINICAL DATA:  77 year old male with new asymmetry of pupils and absent cranial nerve reflexes. Concern for acute intracranial hemorrhage. EXAM: CT HEAD WITHOUT CONTRAST TECHNIQUE: Contiguous axial images were obtained from the base of the skull through the vertex without intravenous contrast. COMPARISON:  09/19/2017 FINDINGS: Brain: No acute intracranial hemorrhage. No midline shift or mass effect. Supratentorial gray-white differentiation is maintained. New hypodensity in the central medulla (image 7). Vascular: Calcifications of anterior and posterior circulation. Skull: No acute bony abnormality. Sinuses/Orbits: Nasoenteric tube.  Endotracheal tube. Other: None IMPRESSION: No acute intracranial hemorrhage. There is new hypodensity in the central medulla, potentially new infarction in the vertebrobasilar territory. Further evaluation with either CT angiogram and/or MR may be useful. These results were discussed with Dr. Nelda Marseille in person at the time of interpretation on Sep 19, 2017 at 11:36 am. Electronically Signed   By: Corrie Mckusick D.O.   On: 19-Sep-2017 11:37   Ct Head Wo Contrast  Result  Date: 09/19/17 CLINICAL DATA:  Initial evaluation for head trauma, fall. EXAM: CT HEAD WITHOUT CONTRAST TECHNIQUE: Contiguous axial images were obtained from the base of the skull through the vertex  without intravenous contrast. COMPARISON:  None available. FINDINGS: Brain: Generalized age-related cerebral atrophy. No acute intracranial hemorrhage. No acute large vessel territory infarct. No mass lesion, midline shift or mass effect. No hydrocephalus. No extra-axial fluid collection. Vascular: No hyperdense vessel. Scattered vascular calcifications noted within the carotid siphons. Skull: There is a focal area of hazy soft tissue stranding with overlying skin thickening within the left postauricular region, of uncertain significance, but could conceivably reflect contusion (series 4, image 10). Scalp soft tissues otherwise unremarkable. Calvarium intact. Sinuses/Orbits: Globes and orbital soft tissues within normal limits. Mastoid air cells and paranasal sinuses are clear. Other: None. IMPRESSION: 1. No acute intracranial abnormality. 2. Focal area of soft tissue stranding with overlying skin thickening within the left postauricular region, of uncertain significance or etiology, but could reflect mild contusion. Correlation with physical exam recommended. No calvarial fracture. 3. Intracranial atherosclerosis. Electronically Signed   By: Jeannine Boga M.D.   On: 2017-10-12 05:14   Ct Angio Neck W Or Wo Contrast  Result Date: 2017/10/12 CLINICAL DATA:  77 year old inpatient found obtunded, blown pupil. Abnormal brainstem reflexes. EXAM: CT ANGIOGRAPHY HEAD AND NECK TECHNIQUE: Multidetector CT imaging of the head and neck was performed using the standard protocol during bolus administration of intravenous contrast. Multiplanar CT image reconstructions and MIPs were obtained to evaluate the vascular anatomy. Carotid stenosis measurements (when applicable) are obtained utilizing NASCET criteria, using the  distal internal carotid diameter as the denominator. CONTRAST:  179mL ISOVUE-370 IOPAMIDOL (ISOVUE-370) INJECTION 76% COMPARISON:  Head CT without contrast 1125 hours today. FINDINGS: CTA NECK Skeleton: The prior sternotomy. Prior C5-C6 ACDF. No acute osseous abnormality identified. Upper chest: Intubated. Endotracheal tube tip just below the thoracic inlet. Enteric tube within the visible esophagus. Mild dependent atelectasis in the upper lungs. No superior mediastinal lymphadenopathy. Other neck: left IJ approach central line partially visible. Small volume fluid layering in the pharynx in the setting of intubation. Negative parapharyngeal and retropharyngeal spaces. Negative sublingual, submandibular and parotid spaces. No neck mass or lymphadenopathy identified. Aortic arch: 3 vessel arch configuration. Mild arch and great vessel origin atherosclerosis. Right carotid system: No brachiocephalic artery or right CCA origin stenosis. Negative right CCA to the bifurcation. Mild to moderate calcified plaque at the right carotid bifurcation and ICA origin without stenosis. There is some soft plaque at the posterior right ICA distal bulb, again without significant stenosis. Tortuous right ICA just below the skull base. Left carotid system: No left CCA origin stenosis despite atherosclerosis. Otherwise negative left CCA proximal to the bifurcation. Soft and calcified plaque at the left carotid bifurcation and left ICA origin continuing into the bulb. However, stenosis is less than 50 % with respect to the distal vessel. Tortuous cervical left ICA just below the skull base. Vertebral arteries: No proximal right subclavian artery stenosis. The race calcified plaque at the right vertebral artery origin without stenosis. The right vertebral artery is patent to the skull base with mild irregularity but no stenosis. No proximal left subclavian artery stenosis. Normal left vertebral artery origin. Tortuous left V1 segment. The  left vertebral artery is patent to the skull base with mild irregularity but no stenosis. CTA HEAD Posterior circulation: Codominant somewhat diminutive distal vertebral arteries are patent to the vertebrobasilar junction without stenosis. Both PICA origins are patent. Normal vertebrobasilar junction. Patent basilar artery, somewhat diminutive. No basilar stenosis. Patent SCA and left PCA origins. Fetal type right PCA origin. Left posterior communicating artery is diminutive or absent. Bilateral PCA branches are patent and  within normal limits. Anterior circulation: Both ICA siphons are patent. There is moderate right and mild left ICA siphon calcified plaque. Only mild associated siphon stenosis on the right. Normal ophthalmic and right posterior communicating artery origins. Patent carotid termini. Normal MCA and ACA origins. Anterior communicating artery and bilateral ACA branches are within normal limits. The right A2 is mildly dominant. The left MCA M1, bifurcation, and left MCA branches are patent and within normal limits. The right MCA M1, bifurcation, and right MCA branches are patent and within normal limits. Venous sinuses: Good intracranial venous contrast also on the arterial phase images. The superior sagittal sinus, torcula, straight sinus, vein of Galen, internal cerebral veins, and basal veins of Rosenthal are patent. Both transverse sinuses, sigmoid sinuses, and IJ bulbs are patent. Anatomic variants: Mildly dominant right ACA A2 segment. Delayed phase: The wedge-shaped hypodensity described in the central medulla at 1125 hours today is less apparent following contrast (series 14, image 1), perhaps due to luxury perfusion. Otherwise stable gray-white matter differentiation. No abnormal intracranial enhancement. Review of the MIP images confirms the above findings IMPRESSION: 1. Negative for large vessel occlusion. Negative for dural venous sinus thrombosis. 2. Head and neck atherosclerosis, but no  significant arterial stenosis identified. No circle of Willis branch occlusion identified. The vertebrobasilar system does appear somewhat diminutive. 3. Continued suspicion of abnormal wedge-shaped hypodensity in the central medulla. Recommend follow-up Brain MRI. 4. Preliminary results of the above were discussed by telephone with Dr. Bruce Donath on 21-Sep-2017 at 1208 hours. Electronically Signed   By: Genevie Ann M.D.   On: 21-Sep-2017 12:32   Dg Chest Port 1 View  Result Date: 2017/09/21 CLINICAL DATA:  Ng tube  intubation EXAM: PORTABLE CHEST - 1 VIEW COMPARISON:  Earlier film of the same day FINDINGS: Endotracheal tube is been placed, tip approximately 7 cm above carina. Nasogastric tube loops in the decompressed stomach. Left IJ central venous catheter to the mid SVC. No pneumothorax. Heart size normal. Some increase in patchy airspace opacities in the lung bases left greater than right. Previous median sternotomy and CABG. Suspect small pleural effusions left greater than right. Cervical fixation hardware partially seen. IMPRESSION: 1. Endotracheal tube, nasogastric tube, and central venous catheter placement as above without apparent complication. No pneumothorax. 2. Worsening bibasilar airspace disease and small effusions, left greater than right Electronically Signed   By: Lucrezia Europe M.D.   On: 09-21-17 07:57   Dg Chest Port 1 View  Result Date: 2017-09-21 CLINICAL DATA:  Acute respiratory distress. Coronary artery disease. Bladder carcinoma. EXAM: PORTABLE CHEST 1 VIEW COMPARISON:  11/15/2011 FINDINGS: Low lung volumes noted. Heart size is within normal limits. Lungs remain clear. Prior CABG again noted. IMPRESSION: Low lung volumes.  No acute findings. Electronically Signed   By: Earle Gell M.D.   On: 09-21-2017 07:11   Dg Abd Portable 1v  Result Date: 2017-09-21 CLINICAL DATA:  Ng tube  intubation EXAM: PORTABLE ABDOMEN - 1 VIEW COMPARISON:  Earlier film of the same day FINDINGS: Multiple dilated  small bowel loops throughout the abdomen, stable number of involved loops and degree of dilatation since earlier film. The colon appears decompressed. Nasogastric tube has been placed into the decompressed stomach. Surgical clips left upper abdomen. Mild spondylitic changes in the lower lumbar spine. IMPRESSION: 1. Nasogastric tube placement in the stomach with persistent small bowel dilatation Electronically Signed   By: Lucrezia Europe M.D.   On: Sep 21, 2017 07:58   Dg Abd Portable 1v  Result  Date: 10/08/2017 CLINICAL DATA:  Abdominal pain and distention. EXAM: PORTABLE ABDOMEN - 1 VIEW COMPARISON:  09/14/2017 FINDINGS: Increased moderate dilatation of small bowel is seen within the abdomen. There is also likely gas within the nondependent transverse colon. These findings favor adynamic ileus over shows small bowel obstruction. IMPRESSION: Findings favor worsening adynamic ileus over small-bowel obstruction, as discussed above. Electronically Signed   By: Earle Gell M.D.   On: 2017-10-08 07:10   Dg Abd Portable 1v  Result Date: 09/14/2017 CLINICAL DATA:  Nausea and vomiting EXAM: PORTABLE ABDOMEN - 1 VIEW COMPARISON:  08/19/2017 FINDINGS: Scattered large and small bowel gas is noted. Some mild small bowel dilatation is noted which may represent small bowel ileus or partial small bowel obstruction. Correlation with the physical exam is recommended. No abnormal mass or abnormal calcifications are seen. No free air is noted. Degenerative change of the lumbar spine is noted. IMPRESSION: Mild small bowel dilatation with evidence of air within the colon. These changes may represent a mild ileus or partial small bowel obstruction. Correlation with the physical exam is recommended. Electronically Signed   By: Inez Catalina M.D.   On: 09/14/2017 07:36    Microbiology Recent Results (from the past 240 hour(s))  Culture, blood (Routine X 2) w Reflex to ID Panel     Status: None   Collection Time: 09/14/2017  5:13 PM   Result Value Ref Range Status   Specimen Description BLOOD RIGHT ANTECUBITAL  Final   Special Requests   Final    BOTTLES DRAWN AEROBIC AND ANAEROBIC Blood Culture adequate volume   Culture   Final    NO GROWTH 5 DAYS Performed at [facility], [address] [place], [place] [address]    Report Status 09/18/2017 FINAL  Final  Culture, blood (Routine X 2) w Reflex to ID Panel     Status: None   Collection Time: 09/23/2017  5:14 PM  Result Value Ref Range Status   Specimen Description BLOOD LEFT ANTECUBITAL  Final   Special Requests   Final    BOTTLES DRAWN AEROBIC AND ANAEROBIC Blood Culture adequate volume   Culture   Final    NO GROWTH 5 DAYS Performed at [facility], [place] [address] [place],  [address]    Report Status 09/18/2017 FINAL  Final    Lab Basic Metabolic Panel: No results for input(s): NA, K, CL, CO2, GLUCOSE, BUN, CREATININE, CALCIUM, MG, PHOS in the last 168 hours. Liver Function Tests: No results for input(s): AST, ALT, ALKPHOS, BILITOT, PROT, ALBUMIN in the last 168 hours. No results for input(s): LIPASE, AMYLASE in the last 168 hours. No results for input(s): AMMONIA in the last 168 hours. CBC: No results for input(s): WBC, NEUTROABS, HGB, HCT, MCV, PLT in the last 168 hours. Cardiac Enzymes: No results for input(s): CKTOTAL, CKMB, CKMBINDEX, TROPONINI in the last 168 hours. Sepsis Labs: No results for input(s): PROCALCITON, WBC, LATICACIDVEN in the last 168 hours.  Procedures/Operations     Shefali Ng 09/22/2017, 4:37 PM

## 2017-10-08 NOTE — Progress Notes (Signed)
Went down with patient to CT since he was so unstable.  CT with contrast was negative for bleed.  CT with contrast showed no CVA.  Spoke with neurology MD multiple times, feeling is this could be an encephalitis.  CT of the abdomen showed some pneumoatosis.  Patient is profoundly acidotic and hypotensive likely from abdominal source.  The neuro exam is still consistent with CVA and neurology would like an MRI but patient is not stable enough for a long trip as I had to push epi multiple times during the CT.  MRI is not an option at this time.    Spoke with wife, patient has clearly told her multiple time he would not want aggressive care.    After discussion decision was made to make patient a full DNR and proceed with comfort care.  Will place orders.  The patient is critically ill with multiple organ systems failure and requires high complexity decision making for assessment and support, frequent evaluation and titration of therapies, application of advanced monitoring technologies and extensive interpretation of multiple databases.   Critical Care Time devoted to patient care services described in this note is  60  Minutes. This time reflects time of care of this signee Dr [name]. This critical care time does not reflect procedure time, or teaching time or supervisory time of PA/NP/Med student/Med Resident etc but could involve care discussion time.  Rush Farmer, M.D. [facility] Pulmonary/Critical Care Medicine. Pager: [phone]. After hours pager: [phone].

## 2017-10-08 NOTE — Progress Notes (Signed)
All imaging noted and reviewed.  Patient is now a DNR for comfort care.  He would not survive an operation at this point and agree with this plan.  No surgical intervention or needs.  We will sign off.  Andre Maldonado 1:42 PM 2017-10-04

## 2017-10-08 NOTE — Consult Note (Signed)
NEURO HOSPITALIST CONSULT NOTE   Requestig physician: Dr. Nelda Marseille  Reason for Consult: Asymmetric pupils  History obtained from: Chart    HPI:                                                                                                                                          Andre Maldonado is an 77 y.o. male who initially presented tho the hospital on Tuesday with nausea, vomiting and abdominal pain; he was found to have partial small bowel obstruction of unknown etiology. Last night he fell out of his bed at about 10:30 PM. He was alert and oriented after the fall with no focal neurological deficits. A CT head showed mild superficial contusion in the left postauricular region. At 7:50 AM today he was noted to be hypotensive at 72/39 with elevated lactate of 13.4, low O2 sats and RR of 30. He was alert and oriented to self at that time. CBG was 31 for which he received D50. He was transferred to the ICU and intubated for acute hypoxic and hypercarbic respiratory failure. At 10:11 AM note from the surgery service documents that he was unresponsive with a new blown left pupil. Neurology was called STAT for further evaluation. STAT CT head was ordered.   His PMHx includes bladder cancer. His active hospital problems this admission include hypokalemia, HTN and AKI.   Past Medical History:  Diagnosis Date  . Arthritis    hands, elbows, knees  . Bladder cancer [facility])    urologist-  [name]  . Coronary arteriosclerosis in native artery    s/p  cabg x1  1981--  Cardiologist-- dr [name]  LOV 02-05-2009  . Hyperlipidemia   . Hypertension   . Hypothyroidism   . Nocturia   . Peripheral vascular disease ([place])    per duplex 03-05-2014  occluded left  SFA  distal level reconstitution at the popliteal  . 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 Wears dentures    upper  . Wears glasses     Past Surgical History:  Procedure Laterality Date  . ANTERIOR CERVICAL  DECOMP/DISCECTOMY FUSION  02/19/2009   C5 -- C6  . CORONARY ARTERY BYPASS GRAFT  1981   in [place] , [place]   x1  LIMA to LAD  . CYSTOSCOPY WITH BIOPSY N/A 06/14/2016   Procedure: CYSTOSCOPY WITH BIOPSY;  Surgeon: [name], MD;  Location: [facility];  Service: Urology;  Laterality: N/A;  . KNEE SURGERY Left 1976  . TRANSURETHRAL RESECTION OF BLADDER TUMOR N/A 04/12/2016   Procedure: TRANSURETHRAL RESECTION OF BLADDER TUMOR (TURBT) WITH EPIRUBICIN  INSTILLATION;  Surgeon: [name], MD;  Location: [facility];  Service: Urology;  Laterality: N/A;  No family history on file.            Social History:  reports that he quit smoking about 32 years ago. His smoking use included cigarettes. He quit after 30.00 years of use. He has never used smokeless tobacco. He reports that he drinks alcohol. He reports that he does not use drugs.  No Known Allergies  MEDICATIONS:                                                                                                                     Scheduled: . fentaNYL (SUBLIMAZE) injection  50 mcg Intravenous Once  . heparin  5,000 Units Subcutaneous Q8H  . levothyroxine  100 mcg Oral QAC breakfast  . protein supplement shake  11 oz Oral BID BM   Continuous: . sodium chloride 150 mL/hr at 09/14/17 1224  . sodium chloride    . [START ON 09/16/2017] famotidine (PEPCID) IV    . fentaNYL infusion INTRAVENOUS 50 mcg/hr (10/14/17 0623)  . norepinephrine (LEVOPHED) Adult infusion 40 mcg/min (Oct 14, 2017 1104)  . piperacillin-tazobactam (ZOSYN)  IV 3.375 g (2017-10-14 1038)  . sodium chloride    . vasopressin (PITRESSIN) infusion - *FOR SHOCK*       ROS:                                                                                                                                       Unable to obtain due to coma.    Blood pressure (!) 47/26, pulse (!) 113, temperature (!) 97.1 F (36.2 C), temperature source  Rectal, resp. rate 20, height 5\' 10"  (1.778 m), weight 86.3 kg (190 lb 4.1 oz), SpO2 94 %.   General Examination:                                                                                                       Physical Exam  HEENT-  [place]. Mild purplish mottling of skin in left postauricular region.    Lungs-  Intubated  Ext: Mottled skin.   Neurological Examination Mental Status: Comatose. No verbal, eye or motor response to any stimuli. No attempts to communicate. No spontaneous movement.  Cranial Nerves: II: No blink to threat. Left pupil 7 mm and unreactive. Right pupil 2 mm and unreactive.   III,IV, VI: No doll's eye reflex. Eyes at midline. No nystagmus.  VIII: No response to voice.  IX,X: Intubated XI: No head movement.  XII: Intubated Motor: Flaccid tone x 4. No movement to any stimulus BUE and BLE.  Sensory: No response to plantar stimulation.  Deep Tendon Reflexes: Absent x 4 Plantars: Mute bilaterally Cerebellar/Gait: Unable to assess   Lab Results: Basic Metabolic Panel: Recent Labs  Lab 10/04/2017 1308 09/12/2017 2200 09/14/17 0411 09/14/17 1427 2017/10/02 0309  NA 135 137 137 139 140  K 2.2* 2.8* 2.4* 4.8 3.9  CL 92* 93* 93* 94* 97*  CO2 27 25 22 24  16*  GLUCOSE 176* 185* 198* 171* 61*  BUN 17 17 18  21* 26*  CREATININE 1.36* 1.38* 1.53* 1.66* 2.57*  CALCIUM 8.9 9.2 9.5 9.8 9.4  MG 2.0  --   --   --  3.3*    CBC: Recent Labs  Lab 09/26/2017 1308 09/14/17 0411 10/02/2017 0309  WBC 20.5* 35.6* 37.3*  NEUTROABS 18.1*  --  35.4*  HGB 14.6 14.9 15.0  HCT 39.5 41.7 44.4  MCV 88.0 90.5 95.7  PLT 370 401* 361    Cardiac Enzymes: No results for input(s): CKTOTAL, CKMB, CKMBINDEX, TROPONINI in the last 168 hours.  Lipid Panel: No results for input(s): CHOL, TRIG, HDL, CHOLHDL, VLDL, LDLCALC in the last 168 hours.  Imaging: Ct Head Wo Contrast  Result Date: 10-02-17 CLINICAL DATA:  Initial evaluation for head trauma, fall. EXAM: CT HEAD WITHOUT CONTRAST  TECHNIQUE: Contiguous axial images were obtained from the base of the skull through the vertex without intravenous contrast. COMPARISON:  None available. FINDINGS: Brain: Generalized age-related cerebral atrophy. No acute intracranial hemorrhage. No acute large vessel territory infarct. No mass lesion, midline shift or mass effect. No hydrocephalus. No extra-axial fluid collection. Vascular: No hyperdense vessel. Scattered vascular calcifications noted within the carotid siphons. Skull: There is a focal area of hazy soft tissue stranding with overlying skin thickening within the left postauricular region, of uncertain significance, but could conceivably reflect contusion (series 4, image 10). Scalp soft tissues otherwise unremarkable. Calvarium intact. Sinuses/Orbits: Globes and orbital soft tissues within normal limits. Mastoid air cells and paranasal sinuses are clear. Other: None. IMPRESSION: 1. No acute intracranial abnormality. 2. Focal area of soft tissue stranding with overlying skin thickening within the left postauricular region, of uncertain significance or etiology, but could reflect mild contusion. Correlation with physical exam recommended. No calvarial fracture. 3. Intracranial atherosclerosis. Electronically Signed   By: Jeannine Boga M.D.   On: 02-Oct-2017 05:14   Dg Chest Port 1 View  Result Date: 02-Oct-2017 CLINICAL DATA:  Ng tube  intubation EXAM: PORTABLE CHEST - 1 VIEW COMPARISON:  Earlier film of the same day FINDINGS: Endotracheal tube is been placed, tip approximately 7 cm above carina. Nasogastric tube loops in the decompressed stomach. Left IJ central venous catheter to the mid SVC. No pneumothorax. Heart size normal. Some increase in patchy airspace opacities in the lung bases left greater than right. Previous median sternotomy and CABG. Suspect small pleural effusions left greater than right. Cervical fixation hardware partially seen. IMPRESSION: 1. Endotracheal tube, nasogastric  tube, and central venous catheter placement as above without apparent  complication. No pneumothorax. 2. Worsening bibasilar airspace disease and small effusions, left greater than right Electronically Signed   By: Lucrezia Europe M.D.   On: 29-Sep-2017 07:57   Dg Chest Port 1 View  Result Date: 09-29-17 CLINICAL DATA:  Acute respiratory distress. Coronary artery disease. Bladder carcinoma. EXAM: PORTABLE CHEST 1 VIEW COMPARISON:  11/15/2011 FINDINGS: Low lung volumes noted. Heart size is within normal limits. Lungs remain clear. Prior CABG again noted. IMPRESSION: Low lung volumes.  No acute findings. Electronically Signed   By: Earle Gell M.D.   On: 09/29/17 07:11   Dg Abd Portable 1v  Result Date: Sep 29, 2017 CLINICAL DATA:  Ng tube  intubation EXAM: PORTABLE ABDOMEN - 1 VIEW COMPARISON:  Earlier film of the same day FINDINGS: Multiple dilated small bowel loops throughout the abdomen, stable number of involved loops and degree of dilatation since earlier film. The colon appears decompressed. Nasogastric tube has been placed into the decompressed stomach. Surgical clips left upper abdomen. Mild spondylitic changes in the lower lumbar spine. IMPRESSION: 1. Nasogastric tube placement in the stomach with persistent small bowel dilatation Electronically Signed   By: Lucrezia Europe M.D.   On: 2017/09/29 07:58   Dg Abd Portable 1v  Result Date: 2017/09/29 CLINICAL DATA:  Abdominal pain and distention. EXAM: PORTABLE ABDOMEN - 1 VIEW COMPARISON:  09/14/2017 FINDINGS: Increased moderate dilatation of small bowel is seen within the abdomen. There is also likely gas within the nondependent transverse colon. These findings favor adynamic ileus over shows small bowel obstruction. IMPRESSION: Findings favor worsening adynamic ileus over small-bowel obstruction, as discussed above. Electronically Signed   By: Earle Gell M.D.   On: 29-Sep-2017 07:10   Dg Abd Portable 1v  Result Date: 09/14/2017 CLINICAL DATA:  Nausea and  vomiting EXAM: PORTABLE ABDOMEN - 1 VIEW COMPARISON:  08/19/2017 FINDINGS: Scattered large and small bowel gas is noted. Some mild small bowel dilatation is noted which may represent small bowel ileus or partial small bowel obstruction. Correlation with the physical exam is recommended. No abnormal mass or abnormal calcifications are seen. No free air is noted. Degenerative change of the lumbar spine is noted. IMPRESSION: Mild small bowel dilatation with evidence of air within the colon. These changes may represent a mild ileus or partial small bowel obstruction. Correlation with the physical exam is recommended. Electronically Signed   By: Inez Catalina M.D.   On: 09/14/2017 07:36    Assessment:  77 year old male, comatose with a blown left pupil following a fall yesterday evening. 1. Exam findings are worrisome for an acute, large, left sided intraaxial or intraaxial lesion compressing left CN3 with probable midline shift and compression of the right cerebral hemisphere and/or brainstem. Most likely a hemorrhage secondary to yesterday's fall.  2. Prognosis appears likely to be poor.   Recommendations: 1. STAT head CT 2. Pressors, ventilator and other support as per ICU team.  40 minutes spent in the emergent neurological evaluation and management of this critically ill patient  Electronically signed: Dr. Kerney Elbe 09/29/17, 11:03 AM

## 2017-10-08 NOTE — Procedures (Signed)
Endotracheal Intubation Procedure Note  Indication for endotracheal intubation: impending airway compromise. Airway Assessment: Mallampati Class: I (soft palate, uvula, fauces, and tonsillar pillars visible). Sedation: etomidate. Paralytic: rocuronium. Lidocaine: no. Atropine: no. Equipment: Macintosh 4 laryngoscope blade and 8.32mm cuffed endotracheal tube. Cricoid Pressure: no. Number of attempts: 1. ETT location confirmed by by auscultation, by CXR and ETCO2 monitor.  Andre Maldonado 09/27/17

## 2017-10-08 NOTE — Progress Notes (Signed)
-   Yesterday's events noted.patient had a fall last night in the bathroom. significant clinical decline overnight. Significant lactic acidosis ( 13.4) as well as  AG Metabolic acidosis. Significantly elevated LFts  ( AST 2202, ALT 1547)  .now with AMS, Hypotension,  Currently intubated.Overall picture concerning for ongoing ischemia.discussed with critical care attending.  neurology  consult as well as repeat CT head .Marland Kitchen Not stable enough to get CT abdomen pelvis. Prognosis is extremely poor.   Otis Brace MD, [place] 16-Sep-2017, 10:58 AM  Contact #  [phone]

## 2017-10-08 NOTE — Progress Notes (Signed)
ABG results reported to Dr.Yacoub, increased peep to 10 at this time per order.

## 2017-10-08 NOTE — Progress Notes (Signed)
Responded to page to support patient's wife( 25yrs) who was alone and had no family or close friends to call or to support her.  Mr. Huesman only has a niece and nephew out of state. Pt's wife was experiencing high levels of anxiety and anticipatory grief, and fearful of that the outcome would not be good.  Staff were exceptionally compassionate and were very attentive to wife concerns. Dr.Yacoub briefed pt's wife of her husband condition and options. Patient is expected to pass soon.  A neighbor was called and came to be with Mrs. Kassim to ensure she got home OK.  Wife consented to let her husband go.  I provided empathetic listening, prayer, emotional ,spiritual and grief support to wife. Facilitated information sharing between staff and wife. Wife and friend still at bedside. Chaplain available as needed.    Oct 02, 2017 1244  Clinical Encounter Type  Visited With Patient and family together;Health care provider  Visit Type Initial;Spiritual support;Death  Referral From Nurse  Spiritual Encounters  Spiritual Needs Prayer;Emotional;Grief support  Stress Factors  Family Stress Factors Exhausted;Loss  Cristopher Peru, [facility], Pager [phone]

## 2017-10-08 DEATH — deceased

## 2018-09-16 IMAGING — CT CT HEAD W/O CM
4 series · 17 of 47 positions shown, 19 images · non-contrast
Comparison: None available.

CLINICAL DATA: Initial evaluation for head trauma, fall.

EXAM:
CT HEAD WITHOUT CONTRAST
TECHNIQUE: Contiguous axial images were obtained from the base of the skull
through the vertex without intravenous contrast.

[Series 4: head without · axial · non-contrast · 0.44mm/px · z∈[-79,+56]mm · 7 of 37 slices shown, 9 images]
[im 5/37  brain]
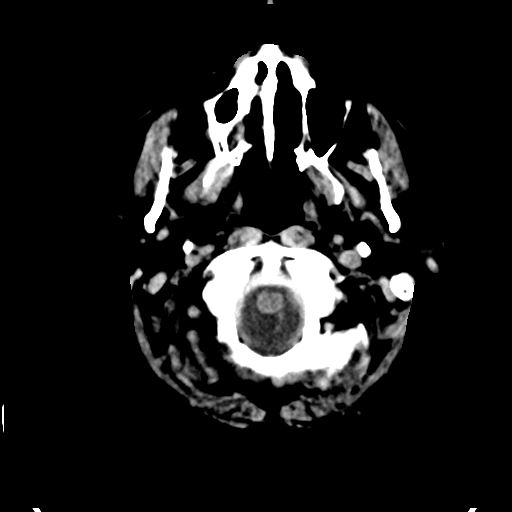
[im 5/37  bone]
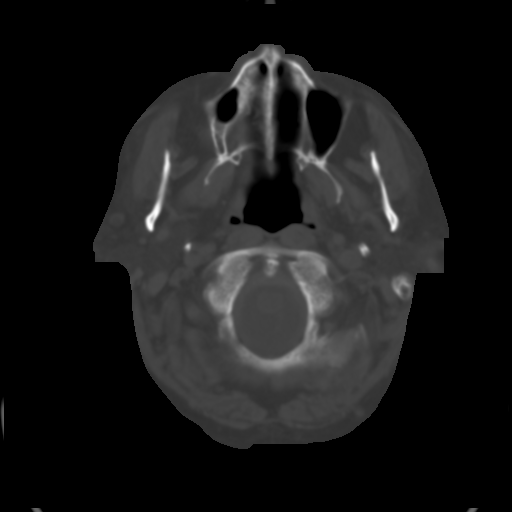
[im 10/37  brain]
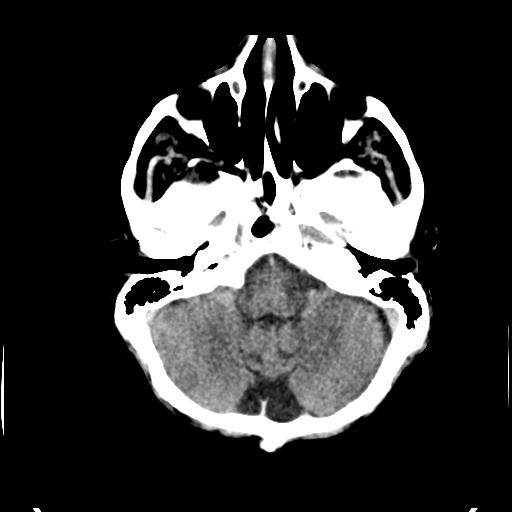
[im 14/37  brain]
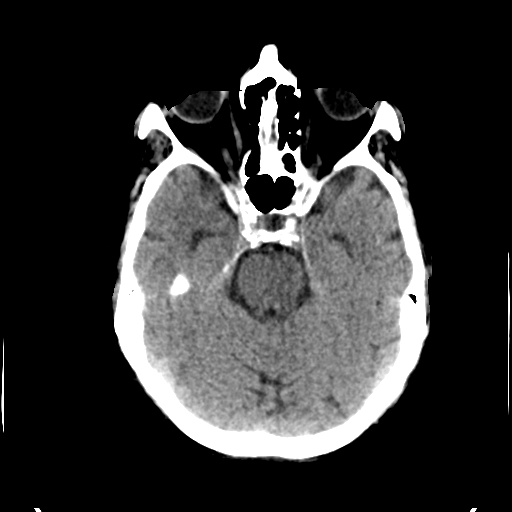
[im 19/37  brain]
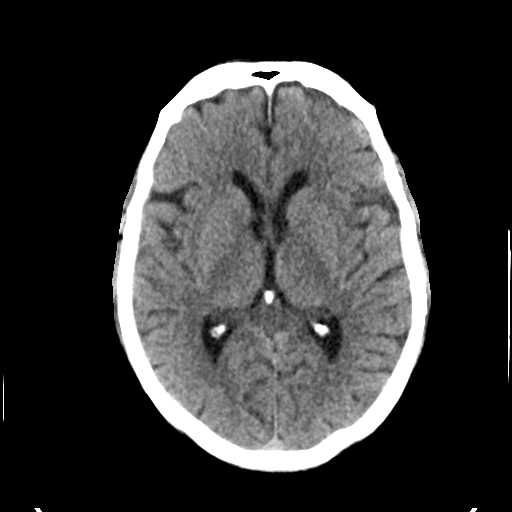
[im 23/37  brain]
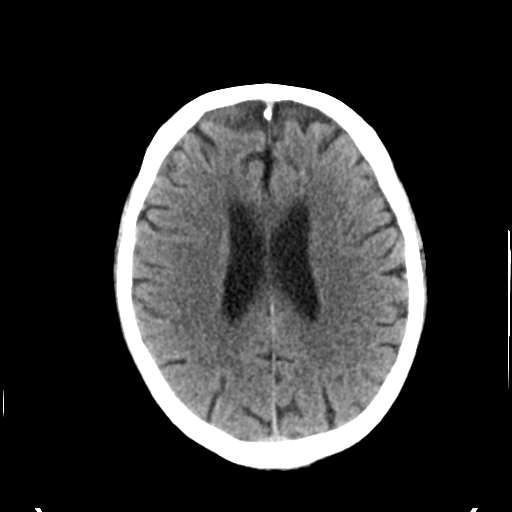
[im 23/37  bone]
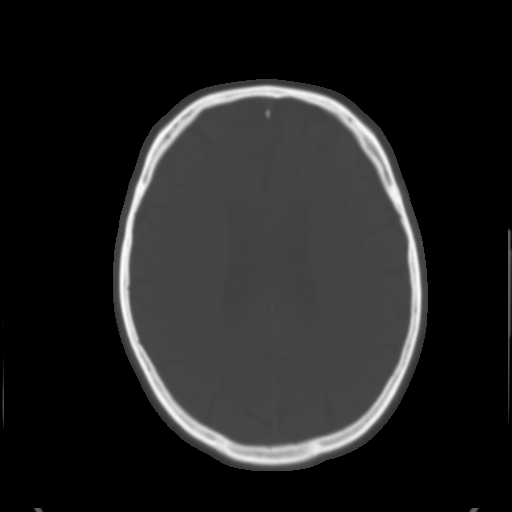
[im 28/37  brain]
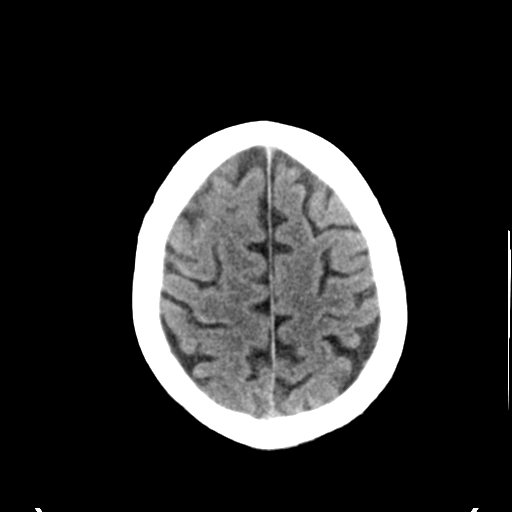
[im 32/37  brain]
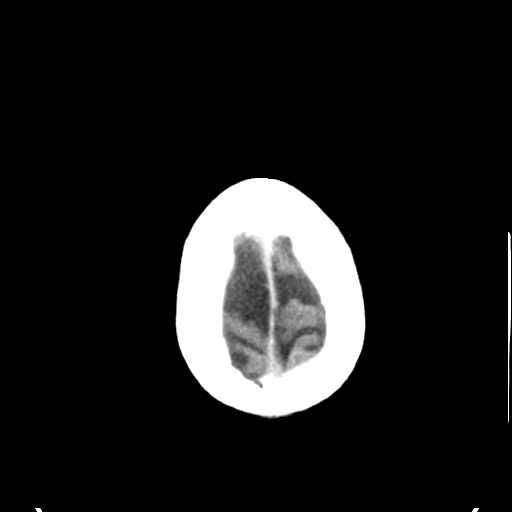

[Series 5: head bone · axial · 0.44mm/px · z∈[-77,-15]mm · 4 of 91 slices shown]
[im 10/91  bone]
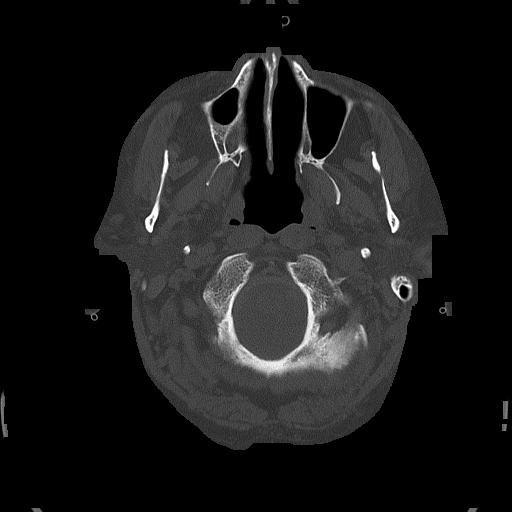
[im 19/91  bone]
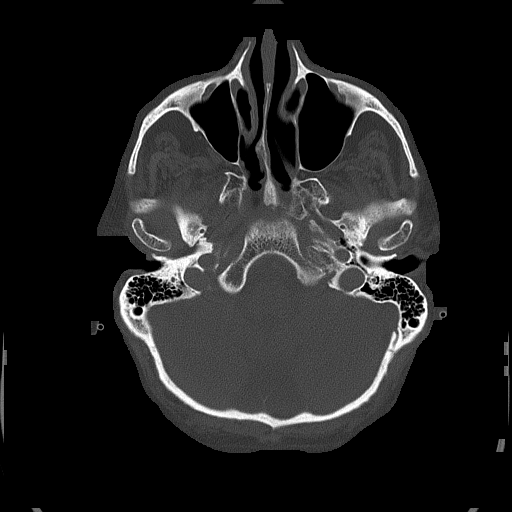
[im 28/91  bone]
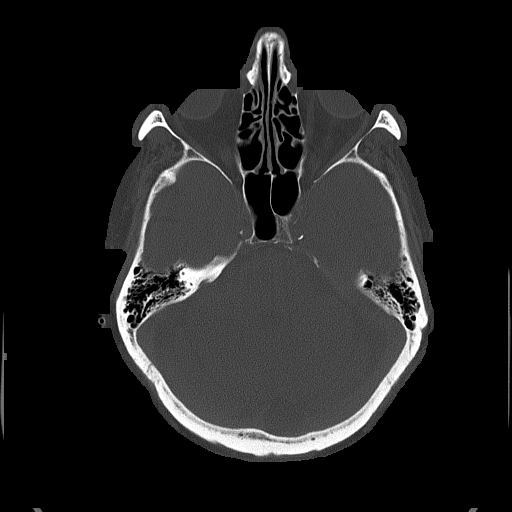
[im 41/91  bone]
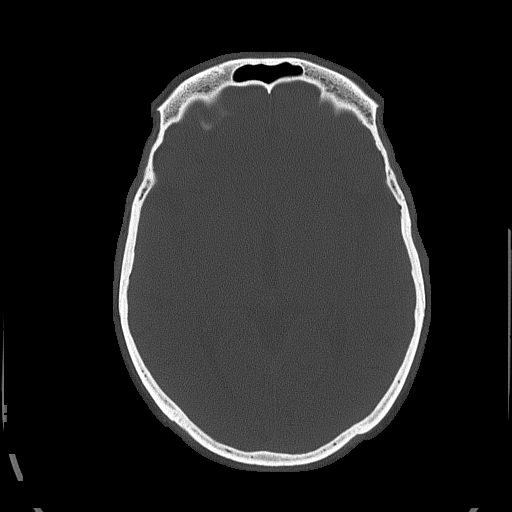

[Series 6: head without cor · coronal · non-contrast · 0.37mm/px · 3 of 73 slices shown]
[im 25/73  brain]
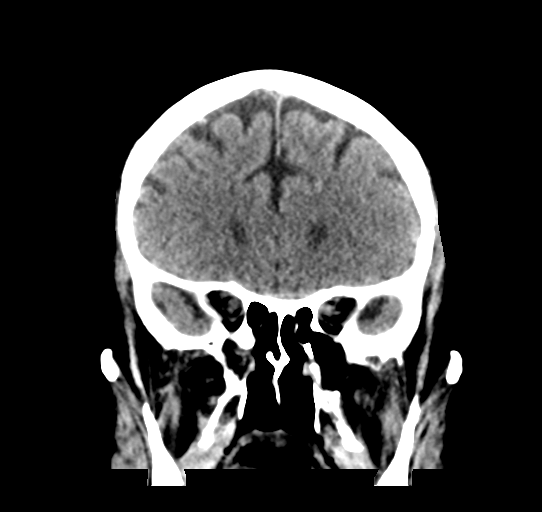
[im 33/73  brain]
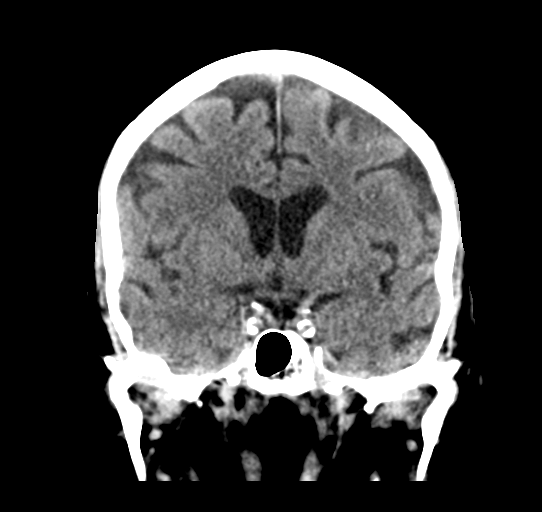
[im 41/73  brain]
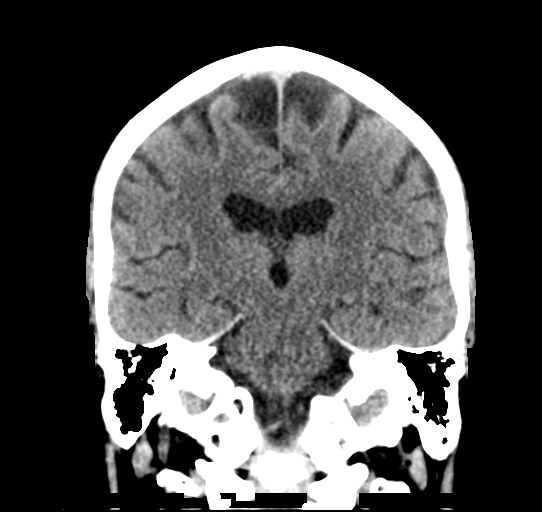

[Series 7: head without sag · sagittal · non-contrast · 0.37mm/px · 3 of 67 slices shown]
[im 23/67  brain]
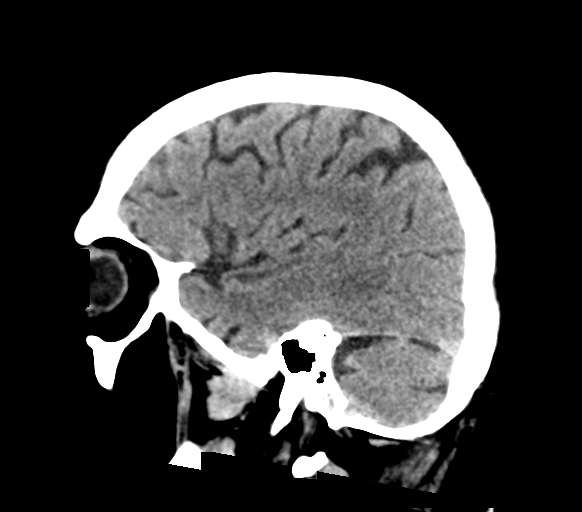
[im 34/67  brain]
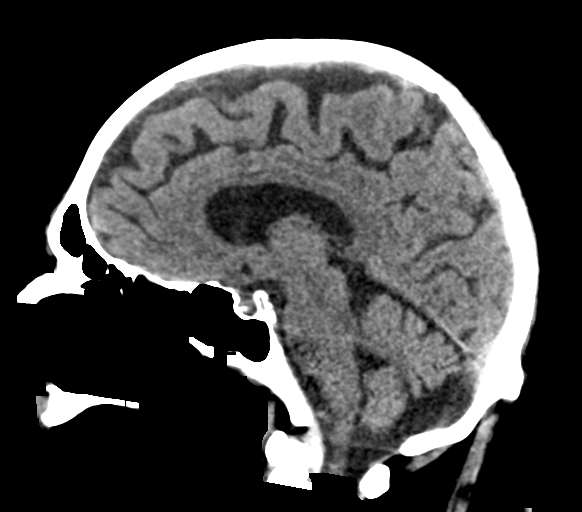
[im 45/67  brain]
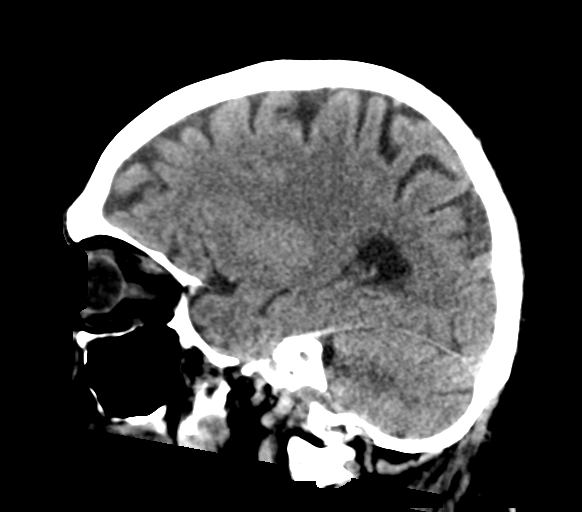

[17 of 47 positions shown; findings below may reference images not displayed]

FINDINGS: Brain: Generalized age-related cerebral atrophy. No acute
intracranial hemorrhage. No acute large vessel territory infarct. No
mass lesion, midline shift or mass effect. No hydrocephalus. No
extra-axial fluid collection.

Vascular: No hyperdense vessel. Scattered vascular calcifications
noted within the carotid siphons.

Skull: There is a focal area of hazy soft tissue stranding with
overlying skin thickening within the left postauricular region, of
uncertain significance, but could conceivably reflect contusion
(series 4, image 10). Scalp soft tissues otherwise unremarkable.
Calvarium intact.

Sinuses/Orbits: Globes and orbital soft tissues within normal
limits. Mastoid air cells and paranasal sinuses are clear.

Other: None.
IMPRESSION: 1. No acute intracranial abnormality.
2. Focal area of soft tissue stranding with overlying skin
thickening within the left postauricular region, of uncertain
significance or etiology, but could reflect mild contusion.
Correlation with physical exam recommended. No calvarial fracture.
3. Intracranial atherosclerosis.

## 2018-09-16 IMAGING — CR DG ABD PORTABLE 1V
2 series · 2 of 2 positions shown · non-contrast
Comparison: Earlier film of the same day

CLINICAL DATA: Ng tube  intubation

EXAM:
PORTABLE ABDOMEN - 1 VIEW

[AP (1 of 2)]
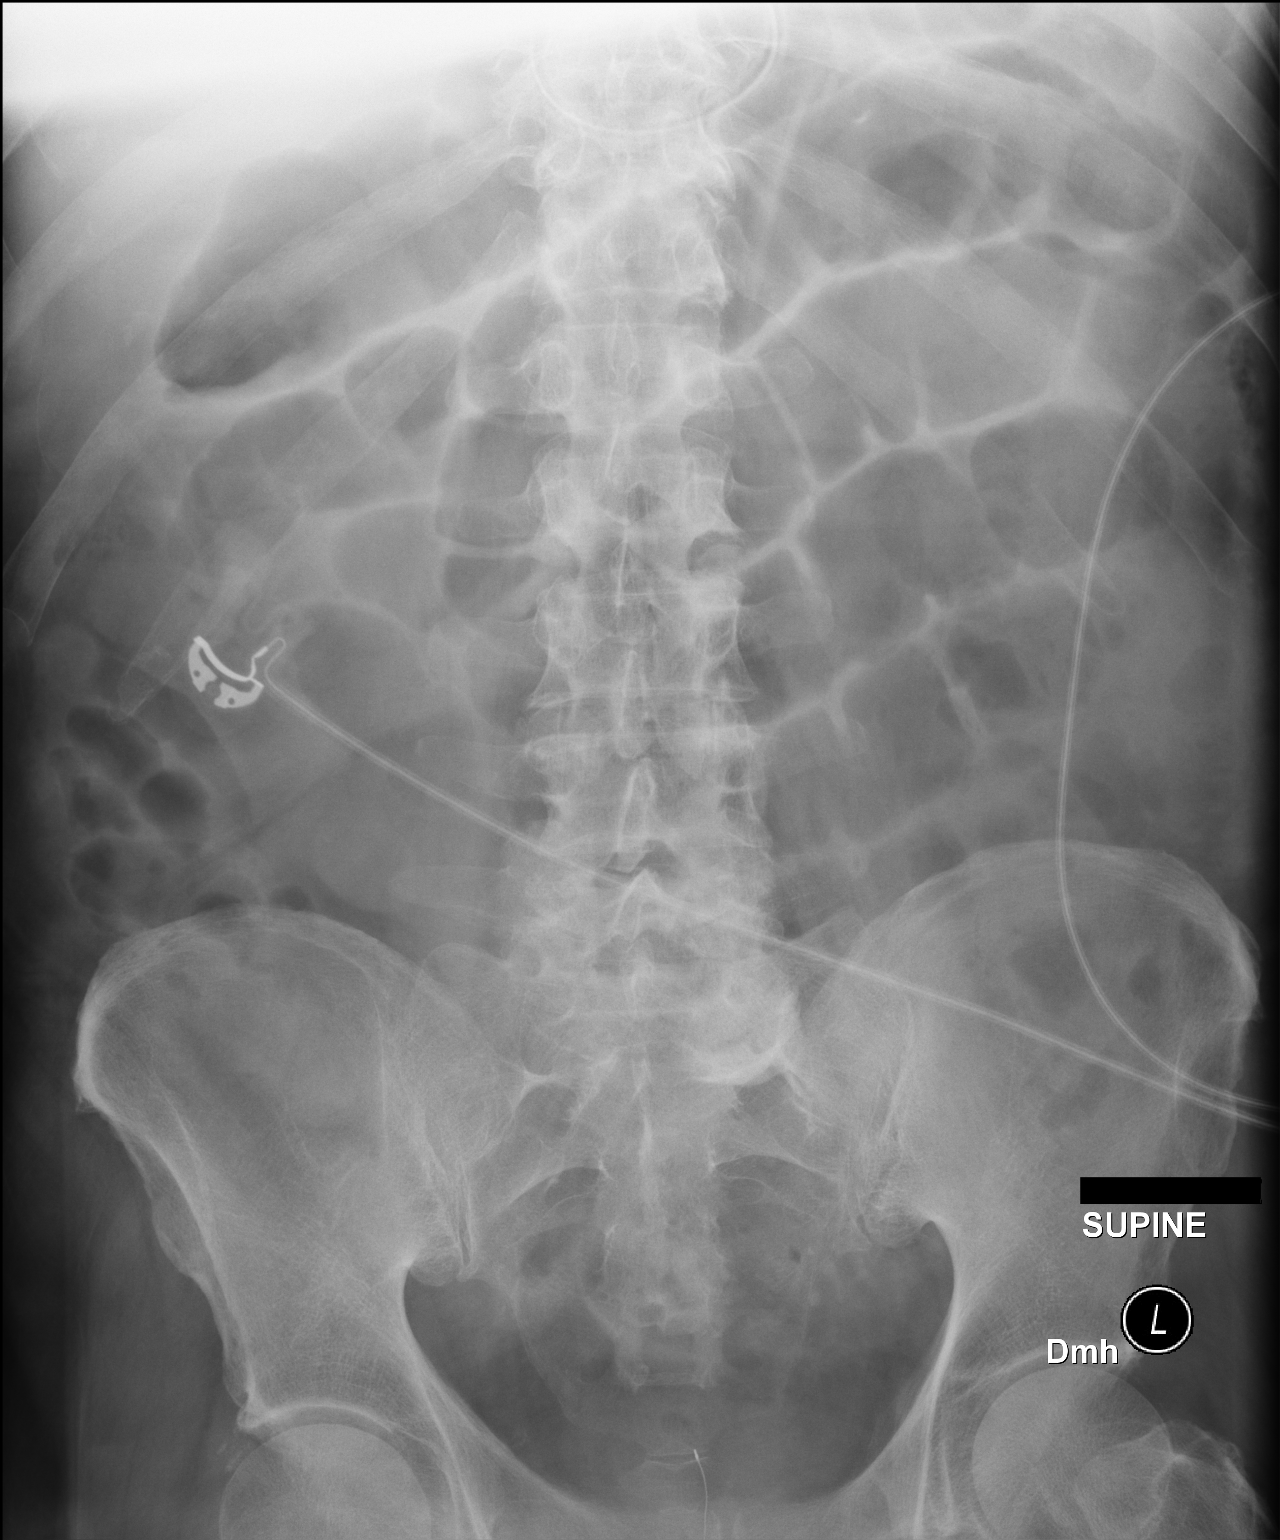

[AP (2 of 2)]
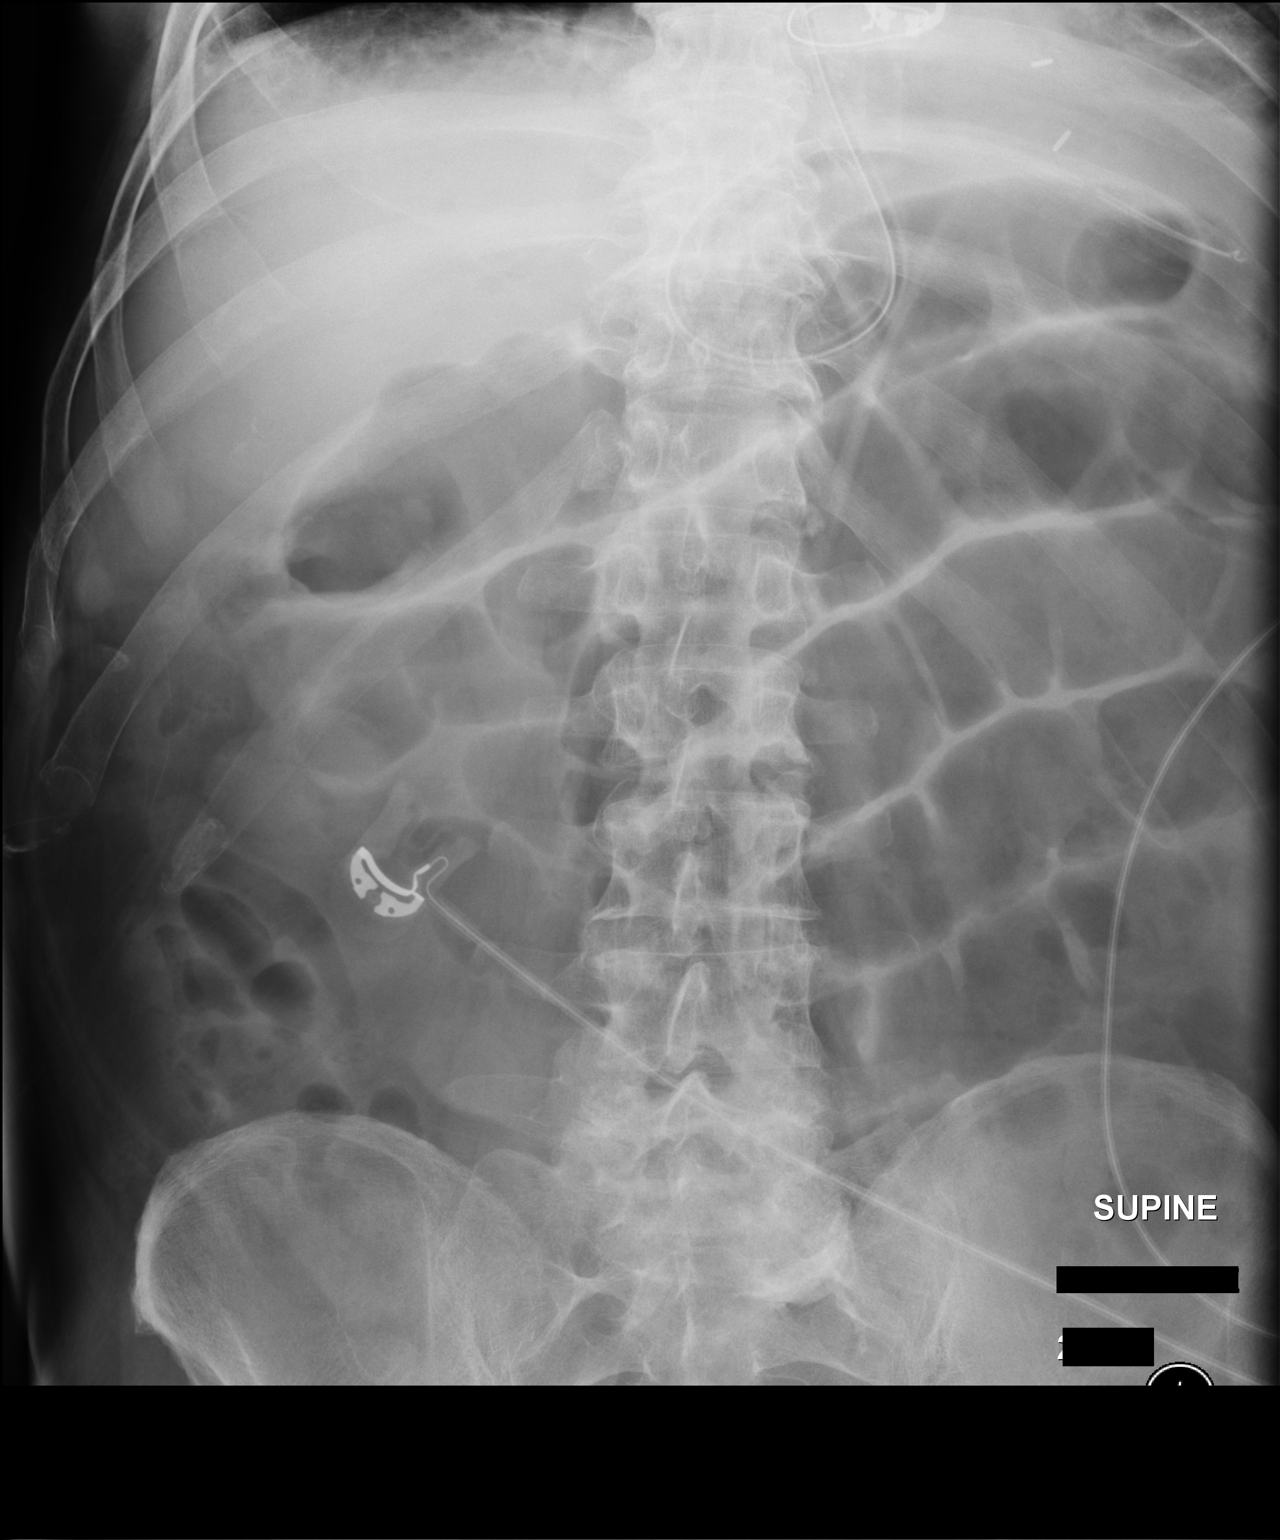

[2 of 2 positions shown; findings below may reference images not displayed]

FINDINGS: Multiple dilated small bowel loops throughout the abdomen, stable
number of involved loops and degree of dilatation since earlier
film. The colon appears decompressed. Nasogastric tube has been
placed into the decompressed stomach.

Surgical clips left upper abdomen. Mild spondylitic changes in the
lower lumbar spine.
IMPRESSION: 1. Nasogastric tube placement in the stomach with persistent small
bowel dilatation
